# Patient Record
Sex: Male | Born: 1970
Health system: Southern US, Community
[De-identification: ages and names within clinical notes are randomized; demographics above are authoritative.]

## PROBLEM LIST (undated history)

## (undated) DIAGNOSIS — I10 Essential (primary) hypertension: Secondary | ICD-10-CM

## (undated) DIAGNOSIS — G43909 Migraine, unspecified, not intractable, without status migrainosus: Secondary | ICD-10-CM

## (undated) DIAGNOSIS — J45909 Unspecified asthma, uncomplicated: Secondary | ICD-10-CM

## (undated) DIAGNOSIS — E785 Hyperlipidemia, unspecified: Secondary | ICD-10-CM

## (undated) HISTORY — PX: BACK SURGERY: SHX140

## (undated) HISTORY — PX: TONSILLECTOMY: SUR1361

---

## 1999-06-12 ENCOUNTER — Encounter: Payer: Self-pay | Admitting: Emergency Medicine

## 1999-06-13 ENCOUNTER — Inpatient Hospital Stay (HOSPITAL_COMMUNITY): Admission: EM | Admit: 1999-06-13 | Discharge: 1999-06-14 | Payer: Self-pay | Admitting: Emergency Medicine

## 2000-08-10 ENCOUNTER — Ambulatory Visit (HOSPITAL_BASED_OUTPATIENT_CLINIC_OR_DEPARTMENT_OTHER): Admission: RE | Admit: 2000-08-10 | Discharge: 2000-08-11 | Payer: Self-pay | Admitting: *Deleted

## 2003-03-09 ENCOUNTER — Encounter: Admission: RE | Admit: 2003-03-09 | Discharge: 2003-03-09 | Payer: Self-pay | Admitting: Neurosurgery

## 2003-03-09 ENCOUNTER — Encounter: Payer: Self-pay | Admitting: Neurosurgery

## 2003-05-15 ENCOUNTER — Encounter: Admission: RE | Admit: 2003-05-15 | Discharge: 2003-05-15 | Payer: Self-pay | Admitting: Neurosurgery

## 2003-06-03 ENCOUNTER — Encounter: Admission: RE | Admit: 2003-06-03 | Discharge: 2003-06-03 | Payer: Self-pay | Admitting: Neurosurgery

## 2006-08-15 ENCOUNTER — Encounter: Admission: RE | Admit: 2006-08-15 | Discharge: 2006-08-15 | Payer: Self-pay | Admitting: Neurosurgery

## 2013-04-29 ENCOUNTER — Emergency Department (HOSPITAL_COMMUNITY)
Admission: EM | Admit: 2013-04-29 | Discharge: 2013-04-29 | Disposition: A | Discharge status: Home / Self Care | Payer: BC Managed Care – PPO | Attending: Emergency Medicine | Admitting: Emergency Medicine

## 2013-04-29 ENCOUNTER — Encounter (HOSPITAL_COMMUNITY): Payer: Self-pay | Admitting: Emergency Medicine

## 2013-04-29 ENCOUNTER — Emergency Department (HOSPITAL_COMMUNITY): Payer: BC Managed Care – PPO

## 2013-04-29 DIAGNOSIS — I10 Essential (primary) hypertension: Secondary | ICD-10-CM | POA: Insufficient documentation

## 2013-04-29 DIAGNOSIS — R0989 Other specified symptoms and signs involving the circulatory and respiratory systems: Secondary | ICD-10-CM | POA: Insufficient documentation

## 2013-04-29 DIAGNOSIS — R61 Generalized hyperhidrosis: Secondary | ICD-10-CM | POA: Insufficient documentation

## 2013-04-29 DIAGNOSIS — R509 Fever, unspecified: Secondary | ICD-10-CM | POA: Insufficient documentation

## 2013-04-29 DIAGNOSIS — Z8669 Personal history of other diseases of the nervous system and sense organs: Secondary | ICD-10-CM | POA: Insufficient documentation

## 2013-04-29 DIAGNOSIS — R5381 Other malaise: Secondary | ICD-10-CM | POA: Insufficient documentation

## 2013-04-29 DIAGNOSIS — F17200 Nicotine dependence, unspecified, uncomplicated: Secondary | ICD-10-CM | POA: Insufficient documentation

## 2013-04-29 DIAGNOSIS — R110 Nausea: Secondary | ICD-10-CM | POA: Insufficient documentation

## 2013-04-29 DIAGNOSIS — K921 Melena: Secondary | ICD-10-CM | POA: Insufficient documentation

## 2013-04-29 DIAGNOSIS — E785 Hyperlipidemia, unspecified: Secondary | ICD-10-CM | POA: Insufficient documentation

## 2013-04-29 DIAGNOSIS — Z79899 Other long term (current) drug therapy: Secondary | ICD-10-CM | POA: Insufficient documentation

## 2013-04-29 DIAGNOSIS — R000 Tachycardia, unspecified: Secondary | ICD-10-CM | POA: Insufficient documentation

## 2013-04-29 DIAGNOSIS — D72829 Elevated white blood cell count, unspecified: Secondary | ICD-10-CM | POA: Insufficient documentation

## 2013-04-29 DIAGNOSIS — M79609 Pain in unspecified limb: Secondary | ICD-10-CM | POA: Insufficient documentation

## 2013-04-29 DIAGNOSIS — R0682 Tachypnea, not elsewhere classified: Secondary | ICD-10-CM | POA: Insufficient documentation

## 2013-04-29 DIAGNOSIS — M549 Dorsalgia, unspecified: Secondary | ICD-10-CM | POA: Insufficient documentation

## 2013-04-29 DIAGNOSIS — M6281 Muscle weakness (generalized): Secondary | ICD-10-CM | POA: Insufficient documentation

## 2013-04-29 DIAGNOSIS — R0609 Other forms of dyspnea: Secondary | ICD-10-CM | POA: Insufficient documentation

## 2013-04-29 DIAGNOSIS — R0600 Dyspnea, unspecified: Secondary | ICD-10-CM

## 2013-04-29 DIAGNOSIS — G8929 Other chronic pain: Secondary | ICD-10-CM | POA: Insufficient documentation

## 2013-04-29 DIAGNOSIS — J45909 Unspecified asthma, uncomplicated: Secondary | ICD-10-CM | POA: Insufficient documentation

## 2013-04-29 HISTORY — DX: Essential (primary) hypertension: I10

## 2013-04-29 HISTORY — DX: Hyperlipidemia, unspecified: E78.5

## 2013-04-29 HISTORY — DX: Migraine, unspecified, not intractable, without status migrainosus: G43.909

## 2013-04-29 HISTORY — DX: Unspecified asthma, uncomplicated: J45.909

## 2013-04-29 LAB — COMPREHENSIVE METABOLIC PANEL
ALT: 27 U/L (ref 0–53)
AST: 27 U/L (ref 0–37)
Albumin: 3.8 g/dL (ref 3.5–5.2)
Alkaline Phosphatase: 59 U/L (ref 39–117)
BUN: 29 mg/dL — ABNORMAL HIGH (ref 6–23)
CO2: 26 mEq/L (ref 19–32)
Calcium: 8.8 mg/dL (ref 8.4–10.5)
Chloride: 103 mEq/L (ref 96–112)
Creatinine, Ser: 0.67 mg/dL (ref 0.50–1.35)
GFR calc Af Amer: >90 mL/min (ref >90)
GFR calc non Af Amer: >90 mL/min (ref >90)
Glucose, Bld: 107 mg/dL — ABNORMAL HIGH (ref 70–99)
Potassium: 4.2 mEq/L (ref 3.5–5.1)
Sodium: 139 mEq/L (ref 135–145)
Total Bilirubin: 0.5 mg/dL (ref 0.3–1.2)
Total Protein: 6.4 g/dL (ref 6.0–8.3)

## 2013-04-29 LAB — CBC WITH DIFFERENTIAL/PLATELET
Basophils Absolute: 0.1 10*3/uL (ref 0.0–0.1)
Basophils Relative: 0 % (ref 0–1)
Eosinophils Absolute: 0.2 10*3/uL (ref 0.0–0.7)
Eosinophils Relative: 1 % (ref 0–5)
HCT: 35.8 % — ABNORMAL LOW (ref 39.0–52.0)
Hemoglobin: 12.1 g/dL — ABNORMAL LOW (ref 13.0–17.0)
Lymphocytes Relative: 19 % (ref 12–46)
Lymphs Abs: 2.9 10*3/uL (ref 0.7–4.0)
MCH: 30.7 pg (ref 26.0–34.0)
MCHC: 33.8 g/dL (ref 30.0–36.0)
MCV: 90.9 fL (ref 78.0–100.0)
Monocytes Absolute: 1.1 10*3/uL — ABNORMAL HIGH (ref 0.1–1.0)
Monocytes Relative: 7 % (ref 3–12)
Neutro Abs: 10.7 10*3/uL — ABNORMAL HIGH (ref 1.7–7.7)
Neutrophils Relative %: 72 % (ref 43–77)
Platelets: 266 10*3/uL (ref 150–400)
RBC: 3.94 MIL/uL — ABNORMAL LOW (ref 4.22–5.81)
RDW: 13.9 % (ref 11.5–15.5)
WBC: 14.9 10*3/uL — ABNORMAL HIGH (ref 4.0–10.5)

## 2013-04-29 LAB — POCT I-STAT TROPONIN I: Troponin i, poc: 0.01 ng/mL (ref 0.00–0.08)

## 2013-04-29 LAB — URINALYSIS, ROUTINE W REFLEX MICROSCOPIC
Bilirubin Urine: NEGATIVE
Glucose, UA: NEGATIVE mg/dL
Hgb urine dipstick: NEGATIVE
Ketones, ur: 15 mg/dL — AB
Leukocytes, UA: NEGATIVE
Nitrite: NEGATIVE
Protein, ur: NEGATIVE mg/dL
Specific Gravity, Urine: 1.029 (ref 1.005–1.030)
Urobilinogen, UA: 0.2 mg/dL (ref 0.0–1.0)
pH: 5.5 (ref 5.0–8.0)

## 2013-04-29 LAB — D-DIMER, QUANTITATIVE: D-Dimer, Quant: 0.33 ug/mL-FEU (ref 0.00–0.48)

## 2013-04-29 LAB — PRO B NATRIURETIC PEPTIDE: Pro B Natriuretic peptide (BNP): 19.4 pg/mL (ref 0–125)

## 2013-04-29 MED ORDER — ONDANSETRON 4 MG PO TBDP
4.0000 mg | ORAL_TABLET | Freq: Once | ORAL | Status: AC
  Administered 2013-04-29: 4 mg via ORAL
  Filled 2013-04-29: qty 1

## 2013-04-29 MED ORDER — HYDROMORPHONE HCL PF 2 MG/ML IJ SOLN
2.0000 mg | Freq: Once | INTRAMUSCULAR | Status: AC
  Administered 2013-04-29: 2 mg via INTRAVENOUS
  Filled 2013-04-29: qty 1

## 2013-04-29 MED ORDER — ONDANSETRON HCL 4 MG/2ML IJ SOLN
4.0000 mg | Freq: Once | INTRAMUSCULAR | Status: AC
  Administered 2013-04-29: 4 mg via INTRAVENOUS
  Filled 2013-04-29: qty 2

## 2013-04-29 MED ORDER — GADOBENATE DIMEGLUMINE 529 MG/ML IV SOLN
20.0000 mL | Freq: Once | INTRAVENOUS | Status: AC
  Administered 2013-04-29: 20 mL via INTRAVENOUS

## 2013-04-29 MED ORDER — SODIUM CHLORIDE 0.9 % IV BOLUS (SEPSIS)
1000.0000 mL | Freq: Once | INTRAVENOUS | Status: AC
  Administered 2013-04-29: 1000 mL via INTRAVENOUS

## 2013-04-29 NOTE — ED Notes (Signed)
Patient transported to MRI 

## 2013-04-29 NOTE — ED Notes (Signed)
Patient transported to X-ray 

## 2013-04-29 NOTE — ED Notes (Signed)
Pt knows that urine is needed. Pt has urinal at bed side  

## 2013-04-29 NOTE — ED Notes (Signed)
Per EMS Pt came from home c/o SOB that started today. Upon EMS arrival pt was pale, cool and diaphoretic. Pt BP changed from 130/88 lying down to 114 systolic standing up. ST on monitor. Pt also c/o abdominal pain and nausea. EMS gave 4mg  zofran IV. Pt reports not eating and drinking normally yesterday due to being busy with moving out of his house.

## 2013-04-29 NOTE — ED Notes (Signed)
MRI called, pt in too much pain to lay flat on back, requesting pain medications. MD Jeraldine Loots made aware.

## 2013-04-29 NOTE — ED Provider Notes (Addendum)
CSN: [id number]     Arrival date & time 04/29/13  1107 History   First MD Initiated Contact with Patient 04/29/13 1111     Chief Complaint  Patient presents with  . Shortness of Breath  . Nausea   (Consider location/radiation/quality/duration/timing/severity/associated sxs/prior Treatment) HPI Patient presents with generalized weakness and dyspnea.  symptoms began over the past days,and has become more incapacitating. There is no new focal pain, including no chest pain.  However, the patient does have chronic pain in his back, lower legs.  He has history of multiple surgical procedures in his back dating back decades. He notes over the past days, as he has been exerting himself, he has had increasing symptoms. There is no new lower extremity edema, discoloration. Symptoms are worse with exertion. There is mild dyspnea at rest, and no fevers, chills. Patient smokes, was counseled on the need to stop this behavior. Patient is disabled, with chronic back pain. Per EMS, on their arrival the patient was pale, cool, diaphoretic, with orthostatic changes in vital signs.  Past Medical History  Diagnosis Date  . Hypertension   . Asthma   . Hyperlipidemia   . Migraines    Past Surgical History  Procedure Laterality Date  . Back surgery    . Tonsillectomy     No family history on file. History  Substance Use Topics  . Smoking status: Current Every Day Smoker -- 0.50 packs/day  . Smokeless tobacco: Not on file  . Alcohol Use: No    Review of Systems  Constitutional:       Per HPI, otherwise negative  HENT:       Per HPI, otherwise negative  Respiratory:       Per HPI, otherwise negative  Cardiovascular:       Per HPI, otherwise negative  Gastrointestinal: Negative for vomiting.  Endocrine:       Negative aside from HPI  Genitourinary:       Neg aside from HPI   Musculoskeletal:       Per HPI, otherwise negative  Skin: Negative.   Neurological: Negative for syncope.     Allergies  Review of patient's allergies indicates no known allergies.  Home Medications   Current Outpatient Rx  Name  Route  Sig  Dispense  Refill  . albuterol (PROVENTIL HFA;VENTOLIN HFA) 108 (90 BASE) MCG/ACT inhaler   Inhalation   Inhale 1 puff into the lungs every 6 (six) hours as needed for wheezing.         . Aspirin-Acetaminophen-Caffeine (GOODY HEADACHE PO)   Oral   Take 2 packets by mouth daily as needed (for pain).         Marland Kitchen atorvastatin (LIPITOR) 20 MG tablet   Oral   Take 20 mg by mouth daily.         . DULoxetine (CYMBALTA) 60 MG capsule   Oral   Take 60 mg by mouth daily.         Marland Kitchen gabapentin (NEURONTIN) 600 MG tablet   Oral   Take 1,200 mg by mouth at bedtime.         . indomethacin (INDOCIN) 25 MG capsule   Oral   Take 25-75 mg by mouth daily as needed (for migraine).         Marland Kitchen lisinopril (PRINIVIL,ZESTRIL) 20 MG tablet   Oral   Take 20 mg by mouth daily.         Marland Kitchen oxyCODONE-acetaminophen (PERCOCET) 10-325 MG per tablet   Oral  Take 1 tablet by mouth every 4 (four) hours as needed for pain.          BP 121/67  Temp(Src) 100.4 F (38 C) (Oral)  Resp 20  SpO2 97% Physical Exam  Nursing note and vitals reviewed. Constitutional: He is oriented to person, place, and time. He appears well-developed. He appears distressed.  Large male, diaphoretic, uncomfortable appearing  HENT:  Head: Normocephalic and atraumatic.  Eyes: Conjunctivae and EOM are normal.  Cardiovascular: Regular rhythm.  Tachycardia present.   Pulmonary/Chest: No stridor. Tachypnea noted. No respiratory distress.  Abdominal: He exhibits no distension.  Musculoskeletal: He exhibits no edema.  Back has no gross deformity, no spreading erythema, no evidence of superficial infection on surgical sites. Both lower extremities are without deformity, no gross asymmetry.  Neurological: He is alert and oriented to person, place, and time.  Skin: Skin is warm. He is  diaphoretic.  Psychiatric: He has a normal mood and affect.    ED Course  Procedures (including critical care time) Labs Review Labs Reviewed  CBC WITH DIFFERENTIAL  COMPREHENSIVE METABOLIC PANEL  PRO B NATRIURETIC PEPTIDE  URINALYSIS, ROUTINE W REFLEX MICROSCOPIC  D-DIMER, QUANTITATIVE   Imaging Review No results found.  EKG Interpretation     Ventricular Rate:  103 PR Interval:  138 QRS Duration: 85 QT Interval:  346 QTC Calculation: 453 R Axis:   52 Text Interpretation:  Age not entered, assumed to be  42 years old for purpose of ECG interpretation Sinus tachycardia Abnormal ekg            cardiac 110 sinus tachycardia abnormal Pulse oximetry is 97% this is normal  6:51 PM Had a lengthy discussion with the patient, prior to which I discussed the patient's case with our radiologist. Patient continues to deny any abdominal pain.  He now states he feels substantially better. He now states that yesterday he took a substantial amount of Goody powder, aspirin. We had a lengthy discussion about the MRI results, the need for GI followup, primary care followup.  Patient's vital signs, notable for no tachycardia.  Blood pressure is appropriate.   Patient requests discharge.   MDM  This gentleman with multiple prior back surgeries now presents with generalized discomfort, generalized fatigue, dyspnea.  Notably, on initial exam the patient has mild tachycardia, mild fever.  Labs are notable for leukocytosis as well.  With the patient's pain on hip flexion, and signs suggestive of infectious versus inflammatory process, patient underwent MRI.  This was largely reassuring.  Notably, the patient had no abdominal pain throughout, and although he had one episode of blood in stool, the absence of peritonitis or any continued rectal bleeding or any abdominal pain at all suggests a low probability of enteric disease.  Patient's endorsement of using aspirin products suggests possible  GI irritation, but again the patient had no abdominal pain throughout.  After several hours of monitoring here, with no decompensation, and a substantial improvement in his condition, the patient was discharged in stable condition with outpatient followup with primary care, pain management, and gastroenterology.    Gerhard Munch, MD 04/29/13 1858  7:09 PM Patient now voices concern to the nursing staff that his illness may be to exposure to Ebola.  He was at a party three days ago, and he voices concern that another attendee may have been ill.  Given the documented time course of Ebola, this concern seems unlikely.  Gerhard Munch, MD 04/29/13 1910

## 2013-04-29 NOTE — ED Notes (Signed)
Pt given urinal to obtain specimen. 

## 2013-05-06 ENCOUNTER — Other Ambulatory Visit: Payer: Self-pay | Admitting: Pain Medicine

## 2013-05-06 DIAGNOSIS — G8929 Other chronic pain: Secondary | ICD-10-CM

## 2013-05-06 DIAGNOSIS — M545 Low back pain, unspecified: Secondary | ICD-10-CM

## 2013-05-06 DIAGNOSIS — R200 Anesthesia of skin: Secondary | ICD-10-CM

## 2013-05-06 DIAGNOSIS — M79605 Pain in left leg: Secondary | ICD-10-CM

## 2013-05-13 ENCOUNTER — Other Ambulatory Visit: Payer: BC Managed Care – PPO

## 2013-05-21 ENCOUNTER — Other Ambulatory Visit: Payer: BC Managed Care – PPO

## 2014-09-18 ENCOUNTER — Ambulatory Visit
Admission: RE | Admit: 2014-09-18 | Discharge: 2014-09-18 | Disposition: A | Discharge status: Home / Self Care | Payer: BLUE CROSS/BLUE SHIELD | Source: Ambulatory Visit | Attending: Family Medicine | Admitting: Family Medicine

## 2014-09-18 ENCOUNTER — Other Ambulatory Visit: Payer: Self-pay | Admitting: Family Medicine

## 2015-01-12 ENCOUNTER — Encounter (HOSPITAL_COMMUNITY): Payer: Self-pay | Admitting: Emergency Medicine

## 2015-01-12 DIAGNOSIS — G43909 Migraine, unspecified, not intractable, without status migrainosus: Secondary | ICD-10-CM | POA: Diagnosis not present

## 2015-01-12 DIAGNOSIS — Z792 Long term (current) use of antibiotics: Secondary | ICD-10-CM | POA: Insufficient documentation

## 2015-01-12 DIAGNOSIS — Z720 Tobacco use: Secondary | ICD-10-CM | POA: Insufficient documentation

## 2015-01-12 DIAGNOSIS — J159 Unspecified bacterial pneumonia: Secondary | ICD-10-CM | POA: Diagnosis not present

## 2015-01-12 DIAGNOSIS — J4520 Mild intermittent asthma, uncomplicated: Secondary | ICD-10-CM | POA: Diagnosis not present

## 2015-01-12 DIAGNOSIS — E785 Hyperlipidemia, unspecified: Secondary | ICD-10-CM | POA: Insufficient documentation

## 2015-01-12 DIAGNOSIS — R0602 Shortness of breath: Secondary | ICD-10-CM | POA: Diagnosis present

## 2015-01-12 DIAGNOSIS — I10 Essential (primary) hypertension: Secondary | ICD-10-CM | POA: Insufficient documentation

## 2015-01-12 DIAGNOSIS — J45909 Unspecified asthma, uncomplicated: Secondary | ICD-10-CM | POA: Insufficient documentation

## 2015-01-12 DIAGNOSIS — Z79899 Other long term (current) drug therapy: Secondary | ICD-10-CM | POA: Diagnosis not present

## 2015-01-12 MED ORDER — ALBUTEROL SULFATE (2.5 MG/3ML) 0.083% IN NEBU
5.0000 mg | INHALATION_SOLUTION | Freq: Once | RESPIRATORY_TRACT | Status: AC
  Administered 2015-01-12: 5 mg via RESPIRATORY_TRACT
  Filled 2015-01-12: qty 6

## 2015-01-12 NOTE — ED Notes (Signed)
Pt. reports SOB with dry cough and fever onset today unrelieved by rescue inhaler .

## 2015-01-13 ENCOUNTER — Emergency Department (HOSPITAL_COMMUNITY): Payer: BLUE CROSS/BLUE SHIELD

## 2015-01-13 ENCOUNTER — Emergency Department (HOSPITAL_COMMUNITY)
Admission: EM | Admit: 2015-01-13 | Discharge: 2015-01-13 | Disposition: A | Discharge status: Home / Self Care | Payer: BLUE CROSS/BLUE SHIELD | Attending: Emergency Medicine | Admitting: Emergency Medicine

## 2015-01-13 DIAGNOSIS — J189 Pneumonia, unspecified organism: Secondary | ICD-10-CM

## 2015-01-13 DIAGNOSIS — J4520 Mild intermittent asthma, uncomplicated: Secondary | ICD-10-CM

## 2015-01-13 LAB — CBC
HCT: 36.5 % — ABNORMAL LOW (ref 39.0–52.0)
Hemoglobin: 11.8 g/dL — ABNORMAL LOW (ref 13.0–17.0)
MCH: 28 pg (ref 26.0–34.0)
MCHC: 32.3 g/dL (ref 30.0–36.0)
MCV: 86.7 fL (ref 78.0–100.0)
Platelets: 251 10*3/uL (ref 150–400)
RBC: 4.21 MIL/uL — ABNORMAL LOW (ref 4.22–5.81)
RDW: 16.9 % — ABNORMAL HIGH (ref 11.5–15.5)
WBC: 11.9 10*3/uL — ABNORMAL HIGH (ref 4.0–10.5)

## 2015-01-13 LAB — BASIC METABOLIC PANEL
Anion gap: 11 (ref 5–15)
BUN: 10 mg/dL (ref 6–20)
CO2: 25 mmol/L (ref 22–32)
Calcium: 8.5 mg/dL — ABNORMAL LOW (ref 8.9–10.3)
Chloride: 104 mmol/L (ref 101–111)
Creatinine, Ser: 0.86 mg/dL (ref 0.61–1.24)
GFR calc Af Amer: >60 mL/min (ref >60)
GFR calc non Af Amer: >60 mL/min (ref >60)
Glucose, Bld: 109 mg/dL — ABNORMAL HIGH (ref 65–99)
Potassium: 3.6 mmol/L (ref 3.5–5.1)
Sodium: 140 mmol/L (ref 135–145)

## 2015-01-13 MED ORDER — STERILE WATER FOR INJECTION IJ SOLN
INTRAMUSCULAR | Status: AC
  Administered 2015-01-13: 2.1 mL
  Filled 2015-01-13: qty 10

## 2015-01-13 MED ORDER — AZITHROMYCIN 250 MG PO TABS
500.0000 mg | ORAL_TABLET | Freq: Once | ORAL | Status: AC
  Administered 2015-01-13: 500 mg via ORAL
  Filled 2015-01-13: qty 2

## 2015-01-13 MED ORDER — AZITHROMYCIN 250 MG PO TABS: 250.0000 mg | ORAL_TABLET | Freq: Every day | ORAL | Status: DC

## 2015-01-13 MED ORDER — CEFTRIAXONE SODIUM 1 G IJ SOLR
1.0000 g | Freq: Once | INTRAMUSCULAR | Status: AC
  Administered 2015-01-13: 1 g via INTRAMUSCULAR
  Filled 2015-01-13: qty 10

## 2015-01-13 NOTE — Discharge Instructions (Signed)
Her x-ray shows that you have pneumonia.  You've been given an injection of antibiotic-coated Rocephin and your first dose of azithromycin, you have been given a prescription for the remaining doses of azithromycin.  Please take this in the evening for the next 4 nights.  He can use her inhaler as needed.  Follow-up with your primary care physician.  Return anytime you develop new or worsening symptoms, shortness of breath, chest pain, fever, that will not resolve with Tylenol or ibuprofen

## 2015-01-13 NOTE — ED Provider Notes (Signed)
CSN: [id number]     Arrival date & time 01/12/15  2342 History   First MD Initiated Contact with Patient 01/13/15 [phone]     Chief Complaint  Patient presents with  . Shortness of Breath  . Cough     (Consider location/radiation/quality/duration/timing/severity/associated sxs/prior Treatment) HPI Comments: This is a 44 year old male with a history of mild intermittent asthma who states for the past 3 days.  He's not felt well.  He said fever up to 102.7 today.  He's had increased use of his inhaler and still feels short of breath.  He also reports a dry cough.  Patient is a 44 y.o. male presenting with shortness of breath and cough. The history is provided by the patient.  Shortness of Breath Severity:  Moderate Onset quality:  Gradual Duration:  3 days Timing:  Intermittent Progression:  Worsening Chronicity:  New Context: activity   Relieved by:  Nothing Worsened by:  Activity Ineffective treatments:  Inhaler Associated symptoms: cough and fever   Associated symptoms: no headaches, no rash, no vomiting and no wheezing   Cough Associated symptoms: fever and shortness of breath   Associated symptoms: no headaches, no myalgias, no rash and no wheezing     Past Medical History  Diagnosis Date  . Hypertension   . Asthma   . Hyperlipidemia   . Migraines    Past Surgical History  Procedure Laterality Date  . Back surgery    . Tonsillectomy     No family history on file. History  Substance Use Topics  . Smoking status: Current Every Day Smoker -- 0.50 packs/day  . Smokeless tobacco: Not on file  . Alcohol Use: No    Review of Systems  Constitutional: Positive for fever.  Respiratory: Positive for cough and shortness of breath. Negative for wheezing.   Gastrointestinal: Negative for nausea and vomiting.  Musculoskeletal: Negative for myalgias.  Skin: Negative for rash.  Neurological: Negative for dizziness and headaches.  All other systems reviewed and are  negative.     Allergies  Review of patient's allergies indicates no known allergies.  Home Medications   Prior to Admission medications   Medication Sig Start Date End Date Taking? Authorizing Provider  albuterol (PROVENTIL HFA;VENTOLIN HFA) 108 (90 BASE) MCG/ACT inhaler Inhale 1 puff into the lungs every 6 (six) hours as needed for wheezing.    Historical Provider, MD  Aspirin-Acetaminophen-Caffeine (GOODY HEADACHE PO) Take 2 packets by mouth daily as needed (for pain).    Historical Provider, MD  atorvastatin (LIPITOR) 20 MG tablet Take 20 mg by mouth daily.    Historical Provider, MD  azithromycin (ZITHROMAX) 250 MG tablet Take 1 tablet (250 mg total) by mouth daily. 01/13/15   Earley FavorGail Chick Cousins, NP  DULoxetine (CYMBALTA) 60 MG capsule Take 60 mg by mouth daily.    Historical Provider, MD  gabapentin (NEURONTIN) 600 MG tablet Take 1,200 mg by mouth at bedtime.    Historical Provider, MD  indomethacin (INDOCIN) 25 MG capsule Take 25-75 mg by mouth daily as needed (for migraine).    Historical Provider, MD  lisinopril (PRINIVIL,ZESTRIL) 20 MG tablet Take 20 mg by mouth daily.    Historical Provider, MD  oxyCODONE-acetaminophen (PERCOCET) 10-325 MG per tablet Take 1 tablet by mouth every 4 (four) hours as needed for pain.    Historical Provider, MD   BP 138/77 mmHg  Pulse 96  Temp(Src) 99.9 F (37.7 C) (Oral)  Resp 20  SpO2 95% Physical Exam  Constitutional: He is oriented  to person, place, and time. He appears well-developed and well-nourished.  HENT:  Head: Normocephalic.  Eyes: Pupils are equal, round, and reactive to light.  Neck: Normal range of motion.  Cardiovascular: Normal rate and regular rhythm.   Pulmonary/Chest: Effort normal and breath sounds normal. No respiratory distress. He has no wheezes. He has no rales. He exhibits no tenderness.  Musculoskeletal: Normal range of motion. He exhibits no edema or tenderness.  Neurological: He is alert and oriented to person, place, and  time.  Skin: Skin is warm. No rash noted.  Nursing note and vitals reviewed.   ED Course  Procedures (including critical care time) Labs Review Labs Reviewed  BASIC METABOLIC PANEL - Abnormal; Notable for the following:    Glucose, Bld 109 (*)    Calcium 8.5 (*)    All other components within normal limits  CBC - Abnormal; Notable for the following:    WBC 11.9 (*)    RBC 4.21 (*)    Hemoglobin 11.8 (*)    HCT 36.5 (*)    RDW 16.9 (*)    All other components within normal limits    Imaging Review Dg Chest 2 View (if Patient Has Fever And/or Copd)  01/13/2015   CLINICAL DATA:  44 year old male with shortness of breath  EXAM: CHEST  2 VIEW  COMPARISON:  Chest radiograph dated 04/29/2013  FINDINGS: Two views of the chest demonstrate diffuse interstitial prominence and nodularity with a focal area of increased opacity in the right middle lobe concerning for developing pneumonia. No significant pleural effusion. No pneumothorax. Stable cardiac silhouette. The osseous structures are grossly unremarkable. Anterior cervical fixation plate and screws.  IMPRESSION: Findings concerning for developing pneumonia. Clinical correlation and follow-up recommended.   Electronically Signed   By: Elgie Collard M.D.   On: 01/13/2015 00:27     EKG Interpretation None     Asians.  Oxygen saturation on room air is 94-96% while talking I discussed this diagnosis with him.  He will be given a shot of Rocephin and the first dose of azithromycin.  He will be given a prescription for azithromycin to continue taking over the next 4 days.  He can use his albuterol inhaler as needed.  Follow-up with his primary care physician  MDM   Final diagnoses:  CAP (community acquired pneumonia)  Asthma, mild intermittent, uncomplicated         Earley Favor, NP 01/13/15 0104  Shon Baton, MD 01/13/15 [phone]

## 2015-01-28 ENCOUNTER — Other Ambulatory Visit: Payer: Self-pay | Admitting: Pain Medicine

## 2015-01-28 DIAGNOSIS — M545 Low back pain, unspecified: Secondary | ICD-10-CM

## 2015-02-11 ENCOUNTER — Ambulatory Visit
Admission: RE | Admit: 2015-02-11 | Discharge: 2015-02-11 | Disposition: A | Discharge status: Home / Self Care | Payer: BLUE CROSS/BLUE SHIELD | Source: Ambulatory Visit | Attending: Pain Medicine | Admitting: Pain Medicine

## 2015-02-11 DIAGNOSIS — M545 Low back pain, unspecified: Secondary | ICD-10-CM

## 2015-08-09 ENCOUNTER — Other Ambulatory Visit: Payer: Self-pay | Admitting: Pain Medicine

## 2015-08-09 DIAGNOSIS — M542 Cervicalgia: Secondary | ICD-10-CM

## 2015-08-15 ENCOUNTER — Other Ambulatory Visit: Payer: BLUE CROSS/BLUE SHIELD

## 2015-08-16 ENCOUNTER — Ambulatory Visit
Admission: RE | Admit: 2015-08-16 | Discharge: 2015-08-16 | Disposition: A | Discharge status: Home / Self Care | Payer: BLUE CROSS/BLUE SHIELD | Source: Ambulatory Visit | Attending: Pain Medicine | Admitting: Pain Medicine

## 2015-08-16 DIAGNOSIS — M542 Cervicalgia: Secondary | ICD-10-CM

## 2015-08-18 ENCOUNTER — Other Ambulatory Visit: Payer: Self-pay | Admitting: Pain Medicine

## 2015-08-18 DIAGNOSIS — M542 Cervicalgia: Secondary | ICD-10-CM

## 2017-03-01 ENCOUNTER — Inpatient Hospital Stay (HOSPITAL_COMMUNITY)
Admission: EM | Admit: 2017-03-01 | Discharge: 2017-03-03 | DRG: 378 | Disposition: A | Discharge status: Home / Self Care | Payer: BLUE CROSS/BLUE SHIELD | Attending: Internal Medicine | Admitting: Internal Medicine

## 2017-03-01 ENCOUNTER — Encounter (HOSPITAL_COMMUNITY): Payer: Self-pay

## 2017-03-01 DIAGNOSIS — M545 Low back pain, unspecified: Secondary | ICD-10-CM

## 2017-03-01 DIAGNOSIS — G8929 Other chronic pain: Secondary | ICD-10-CM | POA: Diagnosis present

## 2017-03-01 DIAGNOSIS — R5383 Other fatigue: Secondary | ICD-10-CM | POA: Diagnosis present

## 2017-03-01 DIAGNOSIS — F329 Major depressive disorder, single episode, unspecified: Secondary | ICD-10-CM | POA: Diagnosis present

## 2017-03-01 DIAGNOSIS — K922 Gastrointestinal hemorrhage, unspecified: Secondary | ICD-10-CM | POA: Diagnosis present

## 2017-03-01 DIAGNOSIS — D500 Iron deficiency anemia secondary to blood loss (chronic): Secondary | ICD-10-CM | POA: Diagnosis present

## 2017-03-01 DIAGNOSIS — J45909 Unspecified asthma, uncomplicated: Secondary | ICD-10-CM | POA: Diagnosis present

## 2017-03-01 DIAGNOSIS — K449 Diaphragmatic hernia without obstruction or gangrene: Secondary | ICD-10-CM | POA: Diagnosis present

## 2017-03-01 DIAGNOSIS — D649 Anemia, unspecified: Secondary | ICD-10-CM | POA: Diagnosis present

## 2017-03-01 DIAGNOSIS — E785 Hyperlipidemia, unspecified: Secondary | ICD-10-CM | POA: Diagnosis present

## 2017-03-01 DIAGNOSIS — Z8711 Personal history of peptic ulcer disease: Secondary | ICD-10-CM | POA: Diagnosis not present

## 2017-03-01 DIAGNOSIS — E876 Hypokalemia: Secondary | ICD-10-CM | POA: Diagnosis present

## 2017-03-01 DIAGNOSIS — F17210 Nicotine dependence, cigarettes, uncomplicated: Secondary | ICD-10-CM | POA: Diagnosis present

## 2017-03-01 DIAGNOSIS — M549 Dorsalgia, unspecified: Secondary | ICD-10-CM | POA: Diagnosis present

## 2017-03-01 DIAGNOSIS — D62 Acute posthemorrhagic anemia: Secondary | ICD-10-CM | POA: Diagnosis present

## 2017-03-01 DIAGNOSIS — K254 Chronic or unspecified gastric ulcer with hemorrhage: Secondary | ICD-10-CM | POA: Diagnosis not present

## 2017-03-01 DIAGNOSIS — I10 Essential (primary) hypertension: Secondary | ICD-10-CM | POA: Diagnosis present

## 2017-03-01 DIAGNOSIS — T39395A Adverse effect of other nonsteroidal anti-inflammatory drugs [NSAID], initial encounter: Secondary | ICD-10-CM | POA: Diagnosis present

## 2017-03-01 LAB — CBC WITH DIFFERENTIAL/PLATELET
Band Neutrophils: 0 %
Basophils Absolute: 0.1 10*3/uL (ref 0.0–0.1)
Basophils Relative: 1 %
Blasts: 0 %
Eosinophils Absolute: 0.5 10*3/uL (ref 0.0–0.7)
Eosinophils Relative: 8 %
HCT: 22.3 % — ABNORMAL LOW (ref 39.0–52.0)
Hemoglobin: 6.1 g/dL — CL (ref 13.0–17.0)
Lymphocytes Relative: 19 %
Lymphs Abs: 1.2 10*3/uL (ref 0.7–4.0)
MCH: 18.4 pg — ABNORMAL LOW (ref 26.0–34.0)
MCHC: 27.4 g/dL — ABNORMAL LOW (ref 30.0–36.0)
MCV: 67.2 fL — ABNORMAL LOW (ref 78.0–100.0)
Metamyelocytes Relative: 0 %
Monocytes Absolute: 0.4 10*3/uL (ref 0.1–1.0)
Monocytes Relative: 6 %
Myelocytes: 0 %
Neutro Abs: 4.2 10*3/uL (ref 1.7–7.7)
Neutrophils Relative %: 66 %
Other: 0 %
Platelets: 364 10*3/uL (ref 150–400)
Promyelocytes Absolute: 0 %
RBC: 3.32 MIL/uL — ABNORMAL LOW (ref 4.22–5.81)
RDW: 20.8 % — ABNORMAL HIGH (ref 11.5–15.5)
WBC: 6.4 10*3/uL (ref 4.0–10.5)
nRBC: 0 /100 WBC

## 2017-03-01 LAB — COMPREHENSIVE METABOLIC PANEL
ALT: 10 U/L — ABNORMAL LOW (ref 17–63)
AST: 18 U/L (ref 15–41)
Albumin: 3.7 g/dL (ref 3.5–5.0)
Alkaline Phosphatase: 53 U/L (ref 38–126)
Anion gap: 11 (ref 5–15)
BUN: 6 mg/dL (ref 6–20)
CO2: 21 mmol/L — ABNORMAL LOW (ref 22–32)
Calcium: 8.5 mg/dL — ABNORMAL LOW (ref 8.9–10.3)
Chloride: 109 mmol/L (ref 101–111)
Creatinine, Ser: 0.69 mg/dL (ref 0.61–1.24)
GFR calc Af Amer: >60 mL/min (ref >60)
GFR calc non Af Amer: >60 mL/min (ref >60)
Glucose, Bld: 94 mg/dL (ref 65–99)
Potassium: 3.5 mmol/L (ref 3.5–5.1)
Sodium: 141 mmol/L (ref 135–145)
Total Bilirubin: 0.3 mg/dL (ref 0.3–1.2)
Total Protein: 6.5 g/dL (ref 6.5–8.1)

## 2017-03-01 LAB — PREPARE RBC (CROSSMATCH)

## 2017-03-01 LAB — ABO/RH: ABO/RH(D): B POS

## 2017-03-01 LAB — POC OCCULT BLOOD, ED: Fecal Occult Bld: POSITIVE — AB

## 2017-03-01 MED ORDER — FAMOTIDINE IN NACL 20-0.9 MG/50ML-% IV SOLN
20.0000 mg | Freq: Once | INTRAVENOUS | Status: AC
  Administered 2017-03-01: 20 mg via INTRAVENOUS
  Filled 2017-03-01: qty 50

## 2017-03-01 MED ORDER — SODIUM CHLORIDE 0.9 % IV SOLN
Freq: Once | INTRAVENOUS | Status: AC
Start: 2017-03-01 — End: 2017-03-01
  Administered 2017-03-01: 20:00:00 via INTRAVENOUS

## 2017-03-01 MED ORDER — OXYCODONE HCL 5 MG PO TABS
5.0000 mg | ORAL_TABLET | Freq: Three times a day (TID) | ORAL | Status: DC | PRN
  Administered 2017-03-02 – 2017-03-03 (×3): 5 mg via ORAL
  Filled 2017-03-01 (×3): qty 1

## 2017-03-01 MED ORDER — ALBUTEROL SULFATE (2.5 MG/3ML) 0.083% IN NEBU
3.0000 mL | INHALATION_SOLUTION | Freq: Once | RESPIRATORY_TRACT | Status: AC
  Administered 2017-03-02: 3 mL via RESPIRATORY_TRACT
  Filled 2017-03-01: qty 3

## 2017-03-01 MED ORDER — OXYCODONE HCL ER 10 MG PO T12A
10.0000 mg | EXTENDED_RELEASE_TABLET | Freq: Two times a day (BID) | ORAL | Status: DC
  Administered 2017-03-02 – 2017-03-03 (×4): 10 mg via ORAL
  Filled 2017-03-01 (×4): qty 1

## 2017-03-01 MED ORDER — ATORVASTATIN CALCIUM 20 MG PO TABS
20.0000 mg | ORAL_TABLET | Freq: Every day | ORAL | Status: DC
  Administered 2017-03-02 – 2017-03-03 (×2): 20 mg via ORAL
  Filled 2017-03-01 (×3): qty 1

## 2017-03-01 MED ORDER — ONDANSETRON HCL 4 MG/2ML IJ SOLN
4.0000 mg | Freq: Four times a day (QID) | INTRAMUSCULAR | Status: DC | PRN
  Administered 2017-03-02: 4 mg via INTRAVENOUS
  Filled 2017-03-01: qty 2

## 2017-03-01 MED ORDER — SODIUM CHLORIDE 0.9 % IV SOLN
8.0000 mg/h | INTRAVENOUS | Status: DC
  Administered 2017-03-02 (×2): 8 mg/h via INTRAVENOUS
  Filled 2017-03-01 (×4): qty 80

## 2017-03-01 MED ORDER — SODIUM CHLORIDE 0.9 % IV SOLN
80.0000 mg | Freq: Once | INTRAVENOUS | Status: AC
  Administered 2017-03-01: 80 mg via INTRAVENOUS
  Filled 2017-03-01: qty 80

## 2017-03-01 MED ORDER — PANTOPRAZOLE SODIUM 40 MG IV SOLR: 40.0000 mg | Freq: Two times a day (BID) | INTRAVENOUS | Status: DC

## 2017-03-01 MED ORDER — ALBUTEROL SULFATE (2.5 MG/3ML) 0.083% IN NEBU: 3.0000 mL | INHALATION_SOLUTION | Freq: Four times a day (QID) | RESPIRATORY_TRACT | Status: DC | PRN

## 2017-03-01 MED ORDER — ACETAMINOPHEN 650 MG RE SUPP: 650.0000 mg | Freq: Four times a day (QID) | RECTAL | Status: DC | PRN

## 2017-03-01 MED ORDER — ALBUTEROL SULFATE HFA 108 (90 BASE) MCG/ACT IN AERS: 1.0000 | INHALATION_SPRAY | Freq: Once | RESPIRATORY_TRACT | Status: DC

## 2017-03-01 MED ORDER — ACETAMINOPHEN 325 MG PO TABS: 650.0000 mg | ORAL_TABLET | Freq: Four times a day (QID) | ORAL | Status: DC | PRN

## 2017-03-01 MED ORDER — SERTRALINE HCL 50 MG PO TABS
50.0000 mg | ORAL_TABLET | Freq: Every day | ORAL | Status: DC
  Administered 2017-03-02 – 2017-03-03 (×2): 50 mg via ORAL
  Filled 2017-03-01 (×2): qty 1

## 2017-03-01 MED ORDER — GABAPENTIN 600 MG PO TABS
600.0000 mg | ORAL_TABLET | Freq: Three times a day (TID) | ORAL | Status: DC
  Administered 2017-03-02 – 2017-03-03 (×4): 600 mg via ORAL
  Filled 2017-03-01 (×4): qty 1

## 2017-03-01 MED ORDER — ONDANSETRON HCL 4 MG PO TABS: 4.0000 mg | ORAL_TABLET | Freq: Four times a day (QID) | ORAL | Status: DC | PRN

## 2017-03-01 NOTE — H&P (Signed)
History and Physical    Jack Greene DOB: 04-19-1971 DOA: 03/01/2017  PCP: Farris Has, MD  Patient coming from: Home  I have personally briefly reviewed patient's old medical records in [facility]  Chief Complaint: Fatigue, low HGB  HPI: Jack Greene is a 46 y.o. male with medical history significant of chronic back pain, takes large quantities of BC goody's powder 4-6 times daily in addition to other meds including indomethacin.  Patient presents to the ED with c/o fatigue, generalized weakness.  Saw PCP, lab work showed HGB 6.2.  Patient called and sent in to ED.  Patient endorses occasional dark stools, especially over last 3 days.  HGB was 14.x back in May of this year according to care everywhere.  Occasional mild epigastric pain with eating.   ED Course: Hemoccult positive, HGB 6.1.   Review of Systems: As per HPI otherwise 10 point review of systems negative.   Past Medical History:  Diagnosis Date  . Asthma   . Hyperlipidemia   . Hypertension   . Migraines     Past Surgical History:  Procedure Laterality Date  . BACK SURGERY    . TONSILLECTOMY       reports that he has been smoking Cigarettes.  He has been smoking about 1.00 pack per day. He does not have any smokeless tobacco history on file. He reports that he does not drink alcohol or use drugs.  No Known Allergies  History reviewed. No pertinent family history.   Prior to Admission medications   Medication Sig Start Date End Date Taking? Authorizing Provider  albuterol (PROVENTIL HFA;VENTOLIN HFA) 108 (90 BASE) MCG/ACT inhaler Inhale 1 puff into the lungs every 6 (six) hours as needed for wheezing.   Yes [provider]  atorvastatin (LIPITOR) 20 MG tablet Take 20 mg by mouth daily.   Yes [provider]  gabapentin (NEURONTIN) 600 MG tablet Take 600 mg by mouth 3 (three) times daily.    Yes [provider]  OXAYDO 5 MG TABA Take 5 mg by  mouth 3 (three) times daily as needed for pain. 02/01/17  Yes [provider]  sertraline (ZOLOFT) 50 MG tablet Take 50 mg by mouth daily. 02/28/17  Yes [provider]  XTAMPZA ER 9 MG C12A Take 9 mg by mouth 2 (two) times daily. 03/01/17  Yes [provider]    Physical Exam: Vitals:   03/01/17 2000 03/01/17 2030 03/01/17 2052 03/01/17 2130  BP: 135/77 128/68 136/78 130/67  Pulse: (!) 56 64 69 65  Resp: 15 17 14 14   Temp:  98.9 F (37.2 C) 98.5 F (36.9 C)   TempSrc:  Oral Oral   SpO2: 100% 100% 100% 100%  Weight:      Height:        Constitutional: NAD, calm, comfortable Eyes: PERRL, lids and conjunctivae normal ENMT: Mucous membranes are moist. Posterior pharynx clear of any exudate or lesions.Normal dentition.  Neck: normal, supple, no masses, no thyromegaly Respiratory: clear to auscultation bilaterally, no wheezing, no crackles. Normal respiratory effort. No accessory muscle use.  Cardiovascular: Regular rate and rhythm, no murmurs / rubs / gallops. No extremity edema. 2+ pedal pulses. No carotid bruits.  Abdomen: no tenderness, no masses palpated. No hepatosplenomegaly. Bowel sounds positive.  Musculoskeletal: no clubbing / cyanosis. No joint deformity upper and lower extremities. Good ROM, no contractures. Normal muscle tone.  Skin: no rashes, lesions, ulcers. No induration Neurologic: CN 2-12 grossly intact. Sensation intact,  DTR normal. Strength 5/5 in all 4.  Psychiatric: Normal judgment and insight. Alert and oriented x 3. Normal mood.    Labs on Admission: I have personally reviewed following labs and imaging studies  CBC:  Recent Labs Lab 03/01/17 1733  WBC 6.4  NEUTROABS 4.2  HGB 6.1*  HCT 22.3*  MCV 67.2*  PLT 364   Basic Metabolic Panel:  Recent Labs Lab 03/01/17 1733  NA 141  K 3.5  CL 109  CO2 21*  GLUCOSE 94  BUN 6  CREATININE 0.69  CALCIUM 8.5*   GFR: Estimated Creatinine Clearance: 157 mL/min (by C-G formula  based on SCr of 0.69 mg/dL). Liver Function Tests:  Recent Labs Lab 03/01/17 1733  AST 18  ALT 10*  ALKPHOS 53  BILITOT 0.3  PROT 6.5  ALBUMIN 3.7   No results for input(s): LIPASE, AMYLASE in the last 168 hours. No results for input(s): AMMONIA in the last 168 hours. Coagulation Profile: No results for input(s): INR, PROTIME in the last 168 hours. Cardiac Enzymes: No results for input(s): CKTOTAL, CKMB, CKMBINDEX, TROPONINI in the last 168 hours. BNP (last 3 results) No results for input(s): PROBNP in the last 8760 hours. HbA1C: No results for input(s): HGBA1C in the last 72 hours. CBG: No results for input(s): GLUCAP in the last 168 hours. Lipid Profile: No results for input(s): CHOL, HDL, LDLCALC, TRIG, CHOLHDL, LDLDIRECT in the last 72 hours. Thyroid Function Tests: No results for input(s): TSH, T4TOTAL, FREET4, T3FREE, THYROIDAB in the last 72 hours. Anemia Panel: No results for input(s): VITAMINB12, FOLATE, FERRITIN, TIBC, IRON, RETICCTPCT in the last 72 hours. Urine analysis:    Component Value Date/Time   COLORURINE YELLOW 04/29/2013 1244   APPEARANCEUR CLEAR 04/29/2013 1244   LABSPEC 1.029 04/29/2013 1244   PHURINE 5.5 04/29/2013 1244   GLUCOSEU NEGATIVE 04/29/2013 1244   HGBUR NEGATIVE 04/29/2013 1244   BILIRUBINUR NEGATIVE 04/29/2013 1244   KETONESUR 15 (A) 04/29/2013 1244   PROTEINUR NEGATIVE 04/29/2013 1244   UROBILINOGEN 0.2 04/29/2013 1244   NITRITE NEGATIVE 04/29/2013 1244   LEUKOCYTESUR NEGATIVE 04/29/2013 1244    Radiological Exams on Admission: No results found.  EKG: Independently reviewed.  Assessment/Plan Principal Problem:   UGIB (upper gastrointestinal bleed) Active Problems:   Symptomatic anemia    1. UGIB - highly suspect an NSAID induced ulcer as cause 1. PPI bolus and gtt 2. Stop NSAIDS 3. Call GI in AM for likely EGD 2. Symptomatic anemia - due to acute blood loss 1. 2 U PRBC transfusion ordered 2. Repeat CBC in  AM 3. Chronic pain - 1. Continue all home meds EXCEPT NSAIDS 2. Did discuss that if NSAID ulcer confirmed on EGD tomorrow, that NSAIDS were now something that he probably couldn't take again ever safely in the future.  DVT prophylaxis: SCDs Code Status: Full Family Communication: No family in room Disposition Plan: Home after admit Consults called: None Admission status: Admit to inpatient - inpatient for GIB with symptomatic anemia requiring transfusion and likely EGD.   Hillary Bow DO [facility] Pager [phone]  If 7AM-7PM, please contact day team taking care of patient [email or web address] Password [email or web address]  03/01/2017, 9:41 PM

## 2017-03-01 NOTE — ED Triage Notes (Addendum)
Pt endorses being sent by PCP for low hgb 6.2. Pt seen pcp for left foot swelling and had an US done and did not have a dvt. VSS. Pt pale and has been feeling very weak recently. Pt also endorses taking goody powders daily for headaches. Denies blood in stool.

## 2017-03-01 NOTE — ED Notes (Signed)
Admitting by the bedside 

## 2017-03-01 NOTE — ED Provider Notes (Signed)
[facility]-EMERGENCY DEPT Provider Note   CSN: [id number] Arrival date & time: 03/01/17  1712     History   Chief Complaint Chief Complaint  Patient presents with  . Abnormal Lab  . Anemia    HPI JENNIFER HOLLAND is a 46 y.o. male with history of asthma, HLD, HTN, and migraines who presents today with chief complaint of gradual onset, progressively worsening lightheadedness and generalized fatigue. He states that symptoms have been ongoing and worsening for several months. He went to see his primary care doctor yesterday Dr. Cheri Guppy with [facility] for evaluation of intermittent left ankle and foot swelling. He had a DVT study which was found to be negative. He received a call today from their office stating that his hemoglobin was very low and that he should come to the emergency department for evaluation. He endorses lightheadedness on changing positions as well as generalized fatigue and DOE. He also endorses intermittent epigastric gnawing pain and dark tarry stools which has been ongoing for several months. He takes 4-6 packets of Goody's powder daily for chronic problems such as headaches and back pain. Currently endorses mild epigastric gnawing abdominal pain which he thinks is due to hunger. Denies chest pain, syncope, numbness, worsening headaches, dysuria, hematuria.  The history is provided by the patient.  Anemia  Associated symptoms include abdominal pain, headaches (chronic, unchanged) and shortness of breath. Pertinent negatives include no chest pain.    Past Medical History:  Diagnosis Date  . Asthma   . Hyperlipidemia   . Hypertension   . Migraines     Patient Active Problem List   Diagnosis Date Noted  . UGIB (upper gastrointestinal bleed) 03/01/2017  . Symptomatic anemia 03/01/2017    Past Surgical History:  Procedure Laterality Date  . BACK SURGERY    . TONSILLECTOMY         Home Medications    Prior to Admission medications   Medication  Sig Start Date End Date Taking? Authorizing Provider  albuterol (PROVENTIL HFA;VENTOLIN HFA) 108 (90 BASE) MCG/ACT inhaler Inhale 1 puff into the lungs every 6 (six) hours as needed for wheezing.   Yes [provider]  atorvastatin (LIPITOR) 20 MG tablet Take 20 mg by mouth daily.   Yes [provider]  gabapentin (NEURONTIN) 600 MG tablet Take 600 mg by mouth 3 (three) times daily.    Yes [provider]  OXAYDO 5 MG TABA Take 5 mg by mouth 3 (three) times daily as needed for pain. 02/01/17  Yes [provider]  sertraline (ZOLOFT) 50 MG tablet Take 50 mg by mouth daily. 02/28/17  Yes [provider]  XTAMPZA ER 9 MG C12A Take 9 mg by mouth 2 (two) times daily. 03/01/17  Yes [provider]    Family History History reviewed. No pertinent family history.  Social History Social History  Substance Use Topics  . Smoking status: Current Every Day Smoker    Packs/day: 1.00    Types: Cigarettes  . Smokeless tobacco: Not on file  . Alcohol use No     Allergies   Patient has no known allergies.   Review of Systems Review of Systems  Constitutional: Positive for fatigue. Negative for chills and fever.  Eyes: Negative for visual disturbance.  Respiratory: Positive for shortness of breath.   Cardiovascular: Negative for chest pain.  Gastrointestinal: Positive for abdominal pain and blood in stool. Negative for constipation, diarrhea, nausea and vomiting.  Skin: Positive for pallor.  Neurological: Positive  for light-headedness and headaches (chronic, unchanged).  All other systems reviewed and are negative.    Physical Exam Updated Vital Signs BP 130/67   Pulse 65   Temp 98.5 F (36.9 C) (Oral)   Resp 14   Ht 6\' 2"  (1.88 m)   Wt 122.5 kg (270 lb)   SpO2 100%   BMI 34.67 kg/m   Physical Exam  Constitutional: He appears well-developed and well-nourished. No distress.  Resting comfortably in bed  HENT:  Head: Normocephalic  and atraumatic.  Right Ear: External ear normal.  Left Ear: External ear normal.  Eyes: Pupils are equal, round, and reactive to light. Conjunctivae and EOM are normal. Right eye exhibits no discharge. Left eye exhibits no discharge.  Palpebral conjunctiva pale bilaterally  Neck: No JVD present. No tracheal deviation present.  Cardiovascular: Normal rate.   Pulmonary/Chest: Effort normal.  Abdominal: Soft. Bowel sounds are normal. He exhibits no distension. There is no tenderness.  Genitourinary:  Genitourinary Comments: Examination performed in the presence of a chaperone. No frank bleeding, no anal fissures, no tears noted. No external or internal hemorrhoids palpated. Prostate is not boggy or tender.  Musculoskeletal: He exhibits no edema.  Neurological: He is alert.  Skin: Skin is warm and dry. No erythema. There is pallor.  Psychiatric: He has a normal mood and affect. His behavior is normal.  Nursing note and vitals reviewed.    ED Treatments / Results  Labs (all labs ordered are listed, but only abnormal results are displayed) Labs Reviewed  COMPREHENSIVE METABOLIC PANEL - Abnormal; Notable for the following:       Result Value   CO2 21 (*)    Calcium 8.5 (*)    ALT 10 (*)    All other components within normal limits  CBC WITH DIFFERENTIAL/PLATELET - Abnormal; Notable for the following:    RBC 3.32 (*)    Hemoglobin 6.1 (*)    HCT 22.3 (*)    MCV 67.2 (*)    MCH 18.4 (*)    MCHC 27.4 (*)    RDW 20.8 (*)    All other components within normal limits  POC OCCULT BLOOD, ED - Abnormal; Notable for the following:    Fecal Occult Bld POSITIVE (*)    All other components within normal limits  CBC  BASIC METABOLIC PANEL  HIV ANTIBODY (ROUTINE TESTING)  POC OCCULT BLOOD, ED  TYPE AND SCREEN  ABO/RH  PREPARE RBC (CROSSMATCH)    EKG  EKG Interpretation None       Radiology No results found.  Procedures Procedures (including critical care time)  Medications  Ordered in ED Medications  albuterol (PROVENTIL HFA;VENTOLIN HFA) 108 (90 Base) MCG/ACT inhaler 1-2 puff (1 puff Inhalation Not Given 03/01/17 2018)  pantoprazole (PROTONIX) 80 mg in sodium chloride 0.9 % 100 mL IVPB (not administered)  pantoprazole (PROTONIX) 80 mg in sodium chloride 0.9 % 250 mL (0.32 mg/mL) infusion (not administered)  pantoprazole (PROTONIX) injection 40 mg (not administered)  albuterol (PROVENTIL HFA;VENTOLIN HFA) 108 (90 Base) MCG/ACT inhaler 1 puff (not administered)  atorvastatin (LIPITOR) tablet 20 mg (not administered)  gabapentin (NEURONTIN) tablet 600 mg (not administered)  sertraline (ZOLOFT) tablet 50 mg (not administered)  OxyCODONE ER C12A 9 mg (not administered)  OxyCODONE HCl (Abuse Deter) (OXAYDO) TABA 5 mg (not administered)  acetaminophen (TYLENOL) tablet 650 mg (not administered)    Or  acetaminophen (TYLENOL) suppository 650 mg (not administered)  ondansetron (ZOFRAN) tablet 4 mg (not administered)  Or  ondansetron [facility]) injection 4 mg (not administered)  0.9 %  sodium chloride infusion ( Intravenous New Bag/Given 03/01/17 1953)  famotidine (PEPCID) IVPB 20 mg premix (0 mg Intravenous Stopped 03/01/17 2028)     Initial Impression / Assessment and Plan / ED Course  I have reviewed the triage vital signs and the nursing notes.  Pertinent labs & imaging results that were available during my care of the patient were reviewed by me and considered in my medical decision making (see chart for details).    Patient with symptomatic anemia, found to have a hemoglobin of 6.1. Afebrile, vital signs are stable. Remainder of lab work is unremarkable. He is Hemoccult-positive, source appears to be from a GI bleed from persistent use of Goody's powder. Initiated type and screen, fluid resuscitation and blood transfusion as well as protonix for epigastric pain. Spoke with hospitalist Dr. Julian Reil who agrees to assume care of patient and bring him into the hospital  for further management and evaluation. Patient seen and evaluated by Dr.Nanavati who agrees with assessment and plan.  CRITICAL CARE Performed by: Jeanie Sewer   Total critical care time: 35 minutes  Critical care time was exclusive of separately billable procedures and treating other patients.  Critical care was necessary to treat or prevent imminent or life-threatening deterioration.  Critical care was time spent personally by me on the following activities: development of treatment plan with patient and/or surrogate as well as nursing, discussions with consultants, evaluation of patient's response to treatment, examination of patient, obtaining history from patient or surrogate, ordering and performing treatments and interventions, ordering and review of laboratory studies, ordering and review of radiographic studies, pulse oximetry and re-evaluation of patient's condition.   Final Clinical Impressions(s) / ED Diagnoses   Final diagnoses:  Symptomatic anemia    New Prescriptions New Prescriptions   No medications on file     Bennye Alm 03/01/17 2224    Derwood Kaplan, MD 03/06/17 [id number]

## 2017-03-02 ENCOUNTER — Encounter (HOSPITAL_COMMUNITY): Payer: Self-pay | Admitting: *Deleted

## 2017-03-02 ENCOUNTER — Encounter (HOSPITAL_COMMUNITY)
Admission: EM | Disposition: A | Discharge status: Home / Self Care | Payer: Self-pay | Source: Home / Self Care | Attending: Internal Medicine

## 2017-03-02 DIAGNOSIS — E785 Hyperlipidemia, unspecified: Secondary | ICD-10-CM

## 2017-03-02 DIAGNOSIS — D649 Anemia, unspecified: Secondary | ICD-10-CM

## 2017-03-02 DIAGNOSIS — F329 Major depressive disorder, single episode, unspecified: Secondary | ICD-10-CM

## 2017-03-02 DIAGNOSIS — M545 Low back pain: Secondary | ICD-10-CM

## 2017-03-02 DIAGNOSIS — K922 Gastrointestinal hemorrhage, unspecified: Secondary | ICD-10-CM

## 2017-03-02 DIAGNOSIS — E876 Hypokalemia: Secondary | ICD-10-CM

## 2017-03-02 HISTORY — PX: ESOPHAGOGASTRODUODENOSCOPY: SHX5428

## 2017-03-02 LAB — HIV ANTIBODY (ROUTINE TESTING W REFLEX): HIV Screen 4th Generation wRfx: NONREACTIVE

## 2017-03-02 LAB — BASIC METABOLIC PANEL
Anion gap: 10 (ref 5–15)
BUN: 5 mg/dL — ABNORMAL LOW (ref 6–20)
CO2: 23 mmol/L (ref 22–32)
Calcium: 8.6 mg/dL — ABNORMAL LOW (ref 8.9–10.3)
Chloride: 109 mmol/L (ref 101–111)
Creatinine, Ser: 0.63 mg/dL (ref 0.61–1.24)
GFR calc Af Amer: >60 mL/min (ref >60)
GFR calc non Af Amer: >60 mL/min (ref >60)
Glucose, Bld: 85 mg/dL (ref 65–99)
Potassium: 3.2 mmol/L — ABNORMAL LOW (ref 3.5–5.1)
Sodium: 142 mmol/L (ref 135–145)

## 2017-03-02 LAB — CBC
HCT: 24.8 % — ABNORMAL LOW (ref 39.0–52.0)
Hemoglobin: 7 g/dL — ABNORMAL LOW (ref 13.0–17.0)
MCH: 19.8 pg — ABNORMAL LOW (ref 26.0–34.0)
MCHC: 28.2 g/dL — ABNORMAL LOW (ref 30.0–36.0)
MCV: 70.1 fL — ABNORMAL LOW (ref 78.0–100.0)
Platelets: 373 10*3/uL (ref 150–400)
RBC: 3.54 MIL/uL — ABNORMAL LOW (ref 4.22–5.81)
RDW: 22.3 % — ABNORMAL HIGH (ref 11.5–15.5)
WBC: 6.9 10*3/uL (ref 4.0–10.5)

## 2017-03-02 LAB — PREPARE RBC (CROSSMATCH)

## 2017-03-02 SURGERY — EGD (ESOPHAGOGASTRODUODENOSCOPY)
Anesthesia: Moderate Sedation

## 2017-03-02 MED ORDER — PROMETHAZINE HCL 25 MG/ML IJ SOLN
12.5000 mg | Freq: Four times a day (QID) | INTRAMUSCULAR | Status: DC | PRN
  Administered 2017-03-02: 12.5 mg via INTRAVENOUS
  Filled 2017-03-02: qty 1

## 2017-03-02 MED ORDER — DIPHENHYDRAMINE HCL 50 MG/ML IJ SOLN
INTRAMUSCULAR | Status: DC | PRN
  Administered 2017-03-02: 25 mg via INTRAVENOUS

## 2017-03-02 MED ORDER — MIDAZOLAM HCL 10 MG/2ML IJ SOLN
INTRAMUSCULAR | Status: DC | PRN
  Administered 2017-03-02: 2 mg via INTRAVENOUS
  Administered 2017-03-02 (×3): 1 mg via INTRAVENOUS

## 2017-03-02 MED ORDER — FENTANYL CITRATE (PF) 100 MCG/2ML IJ SOLN
INTRAMUSCULAR | Status: DC | PRN
  Administered 2017-03-02 (×2): 25 ug via INTRAVENOUS

## 2017-03-02 MED ORDER — MIDAZOLAM HCL 5 MG/ML IJ SOLN
INTRAMUSCULAR | Status: AC
  Filled 2017-03-02: qty 2

## 2017-03-02 MED ORDER — POTASSIUM CHLORIDE CRYS ER 20 MEQ PO TBCR
40.0000 meq | EXTENDED_RELEASE_TABLET | Freq: Once | ORAL | Status: DC
  Filled 2017-03-02: qty 2

## 2017-03-02 MED ORDER — FENTANYL CITRATE (PF) 100 MCG/2ML IJ SOLN
INTRAMUSCULAR | Status: AC
  Filled 2017-03-02: qty 4

## 2017-03-02 MED ORDER — SODIUM CHLORIDE 0.9 % IV SOLN
INTRAVENOUS | Status: DC
  Administered 2017-03-02: 500 mL via INTRAVENOUS

## 2017-03-02 MED ORDER — BUTAMBEN-TETRACAINE-BENZOCAINE 2-2-14 % EX AERO
INHALATION_SPRAY | CUTANEOUS | Status: DC | PRN
  Administered 2017-03-02: 1 via TOPICAL

## 2017-03-02 MED ORDER — DIPHENHYDRAMINE HCL 50 MG/ML IJ SOLN
INTRAMUSCULAR | Status: AC
  Filled 2017-03-02: qty 1

## 2017-03-02 MED ORDER — BUTALBITAL-APAP-CAFFEINE 50-325-40 MG PO TABS
2.0000 | ORAL_TABLET | Freq: Once | ORAL | Status: AC
  Administered 2017-03-03: 2 via ORAL
  Filled 2017-03-02 (×2): qty 2

## 2017-03-02 NOTE — Progress Notes (Signed)
PROGRESS NOTE    Jack Greene  JXB:[id number] DOB: 1971-06-04 DOA: 03/01/2017 PCP: Farris Has, MD   Chief Complaint  Patient presents with  . Abnormal Lab  . Anemia     Brief Narrative:  HPI on 03/01/2017 by Dr. Lyda Perone Jack Greene is a 46 y.o. male with medical history significant of chronic back pain, takes large quantities of BC goody's powder 4-6 times daily in addition to other meds including indomethacin. Patient presents to the ED with c/o fatigue, generalized weakness.  Saw PCP, lab work showed HGB 6.2.  Patient called and sent in to ED. Patient endorses occasional dark stools, especially over last 3 days.  HGB was 14.x back in May of this year according to care everywhere. Occasional mild epigastric pain with eating. Assessment & Plan   Upper GI bleed/Symptomatic Anemia/Anemia of blood loss -Likely secondary to NSAID use -Patient was taking Marlin Canary and BC powders for back pain -Hemoglobin on admission was 6.1 -Patient transfused 2 units PRBC with hemoglobin 7, will order additional 2 units to be transfused today -Gastroenterology consulted and appreciated -Status post EGD: Normal air next, small hiatal hernia, nonbleeding gastric ulcers with no stigmata of bleeding. Acute gastritis, biopsied. Normal duodenal bulb, first second and third portion of duodenum. -Gastroneurology recommended clear liquid diet with slow advancement. Restart iron, avoid NSAIDs. Continue Protonix 40 mg BID x 3 months. Return to GI clinic in 4 weeks, with repeat endoscopy in 2-3 months. May need treatment for H. pylori if positive. -Will continue to monitor patient and CBC.  Hypokalemia -Will replace. Monitor BMP  Chronic pain -Patient sees pain management physician, and will need outpatient MRI. -Continue gabapentin and pain control  Depression -Continue Zoloft  Hyperlipidemia -Continue Lipitor  DVT Prophylaxis  SCDS  Code Status: Full  Family Communication: None  at bedside  Disposition Plan: Admitted. Pending improvement in hemoglobin   Consultants Gastroenterology  Procedures  EGD  Antibiotics   Anti-infectives    None      Subjective:   Jack Greene seen and examined today.  Continues to complain of back pain. States he was having dark stools tarry at times. Thought it was secondary to his iron and diet. Denies any abdominal pain, nausea or vomiting, diarrhea or constipation, dizziness or headache.    Objective:   Vitals:   03/02/17 1430 03/02/17 1434 03/02/17 1440 03/02/17 1450  BP: (!) 117/51 (!) 102/35 (!) 96/39 (!) 109/40  Pulse: 80 77 73 70  Resp: 15 15 14 14   Temp:  97.9 F (36.6 C)    TempSrc:  Oral    SpO2: 96% 95% 95% 94%  Weight:      Height:        Intake/Output Summary (Last 24 hours) at 03/02/17 1536 Last data filed at 03/02/17 1308  Gross per 24 hour  Intake          1030.67 ml  Output                0 ml  Net          1030.67 ml   Filed Weights   03/01/17 1727  Weight: 122.5 kg (270 lb)    Exam  General: Well developed, well nourished, NAD, appears stated age  HEENT: NCAT, mucous membranes moist.   Cardiovascular: S1 S2 auscultated, RRR, no murmurs  Respiratory: Clear to auscultation bilaterally with equal chest rise  Abdomen: Soft, obese, nontender, nondistended, + bowel sounds  Extremities: warm dry without cyanosis clubbing  or edema  Neuro: AAOx3, nonfocal  Psych: appropriate   Data Reviewed: I have personally reviewed following labs and imaging studies  CBC:  Recent Labs Lab 03/01/17 1733 03/02/17 0549  WBC 6.4 6.9  NEUTROABS 4.2  --   HGB 6.1* 7.0*  HCT 22.3* 24.8*  MCV 67.2* 70.1*  PLT 364 373   Basic Metabolic Panel:  Recent Labs Lab 03/01/17 1733 03/02/17 0549  NA 141 142  K 3.5 3.2*  CL 109 109  CO2 21* 23  GLUCOSE 94 85  BUN 6 5*  CREATININE 0.69 0.63  CALCIUM 8.5* 8.6*   GFR: Estimated Creatinine Clearance: 157 mL/min (by C-G formula based on  SCr of 0.63 mg/dL). Liver Function Tests:  Recent Labs Lab 03/01/17 1733  AST 18  ALT 10*  ALKPHOS 53  BILITOT 0.3  PROT 6.5  ALBUMIN 3.7   No results for input(s): LIPASE, AMYLASE in the last 168 hours. No results for input(s): AMMONIA in the last 168 hours. Coagulation Profile: No results for input(s): INR, PROTIME in the last 168 hours. Cardiac Enzymes: No results for input(s): CKTOTAL, CKMB, CKMBINDEX, TROPONINI in the last 168 hours. BNP (last 3 results) No results for input(s): PROBNP in the last 8760 hours. HbA1C: No results for input(s): HGBA1C in the last 72 hours. CBG: No results for input(s): GLUCAP in the last 168 hours. Lipid Profile: No results for input(s): CHOL, HDL, LDLCALC, TRIG, CHOLHDL, LDLDIRECT in the last 72 hours. Thyroid Function Tests: No results for input(s): TSH, T4TOTAL, FREET4, T3FREE, THYROIDAB in the last 72 hours. Anemia Panel: No results for input(s): VITAMINB12, FOLATE, FERRITIN, TIBC, IRON, RETICCTPCT in the last 72 hours. Urine analysis:    Component Value Date/Time   COLORURINE YELLOW 04/29/2013 1244   APPEARANCEUR CLEAR 04/29/2013 1244   LABSPEC 1.029 04/29/2013 1244   PHURINE 5.5 04/29/2013 1244   GLUCOSEU NEGATIVE 04/29/2013 1244   HGBUR NEGATIVE 04/29/2013 1244   BILIRUBINUR NEGATIVE 04/29/2013 1244   KETONESUR 15 (A) 04/29/2013 1244   PROTEINUR NEGATIVE 04/29/2013 1244   UROBILINOGEN 0.2 04/29/2013 1244   NITRITE NEGATIVE 04/29/2013 1244   LEUKOCYTESUR NEGATIVE 04/29/2013 1244   Sepsis Labs: @LABRCNTIP (procalcitonin:4,lacticidven:4)  )No results found for this or any previous visit (from the past 240 hour(s)).    Radiology Studies: No results found.   Scheduled Meds: . atorvastatin  20 mg Oral Daily  . gabapentin  600 mg Oral TID  . oxyCODONE  10 mg Oral BID  . [START ON 03/05/2017] pantoprazole  40 mg Intravenous Q12H  . sertraline  50 mg Oral Daily   Continuous Infusions:   LOS: 1 day   Time Spent in  minutes   30 minutes  Brecklyn Galvis D.O. on 03/02/2017 at 3:36 PM  Between 7am to 7pm - Pager - [phone]  After 7pm go to [email or web address] - password [email or web address]  And look for the night coverage person covering for me after hours  [facility] Office  [phone]

## 2017-03-02 NOTE — Consult Note (Signed)
Reason for Consult: Anemia guaiac positivity Referring Physician: Hospital team  Jack Greene is an 46 y.o. male.  HPI: Patient seen and examined and his hospital computer chart and our [place] office computer chart was reviewed and he has not had much GI symptoms over the years and no previous GI procedures and no GI problems run in the family and he does have a history of iron deficiency in the past but they never told him why he was treated with iron for a while and up until recently has last few hemoglobins have been okay and he has been on lots of aspirin and nonsteroidals for headaches and back pain and his bowels are occasionally dark and his never seen bright red or maroon blood and he has no other complaints  Past Medical History:  Diagnosis Date  . Asthma   . Hyperlipidemia   . Hypertension   . Migraines     Past Surgical History:  Procedure Laterality Date  . BACK SURGERY    . TONSILLECTOMY      History reviewed. No pertinent family history.  Social History:  reports that he has been smoking Cigarettes.  He has been smoking about 1.00 pack per day. He does not have any smokeless tobacco history on file. He reports that he does not drink alcohol or use drugs.  Allergies: No Known Allergies  Medications: I have reviewed the patient's current medications.  Results for orders placed or performed during the hospital encounter of 03/01/17 (from the past 48 hour(s))  Comprehensive metabolic panel     Status: Abnormal   Collection Time: 03/01/17  5:33 PM  Result Value Ref Range   Sodium 141 135 - 145 mmol/L   Potassium 3.5 3.5 - 5.1 mmol/L   Chloride 109 101 - 111 mmol/L   CO2 21 (L) 22 - 32 mmol/L   Glucose, Bld 94 65 - 99 mg/dL   BUN 6 6 - 20 mg/dL   Creatinine, Ser 0.69 0.61 - 1.24 mg/dL   Calcium 8.5 (L) 8.9 - 10.3 mg/dL   Total Protein 6.5 6.5 - 8.1 g/dL   Albumin 3.7 3.5 - 5.0 g/dL   AST 18 15 - 41 U/L   ALT 10 (L) 17 - 63 U/L   Alkaline Phosphatase 53 38 -  126 U/L   Total Bilirubin 0.3 0.3 - 1.2 mg/dL   GFR calc non Af Amer >60 >60 mL/min   GFR calc Af Amer >60 >60 mL/min    Comment: (NOTE) The eGFR has been calculated using the CKD EPI equation. This calculation has not been validated in all clinical situations. eGFR's persistently <60 mL/min signify possible Chronic Kidney Disease.    Anion gap 11 5 - 15  CBC with Differential     Status: Abnormal   Collection Time: 03/01/17  5:33 PM  Result Value Ref Range   WBC 6.4 4.0 - 10.5 K/uL   RBC 3.32 (L) 4.22 - 5.81 MIL/uL   Hemoglobin 6.1 (LL) 13.0 - 17.0 g/dL    Comment: REPEATED TO VERIFY CRITICAL RESULT CALLED TO, READ BACK BY AND VERIFIED WITH: J EASLEY,RN 1819 03/01/2017 D BRADLEY    HCT 22.3 (L) 39.0 - 52.0 %   MCV 67.2 (L) 78.0 - 100.0 fL   MCH 18.4 (L) 26.0 - 34.0 pg   MCHC 27.4 (L) 30.0 - 36.0 g/dL   RDW 20.8 (H) 11.5 - 15.5 %   Platelets 364 150 - 400 K/uL   Neutrophils Relative % 66 %  Lymphocytes Relative 19 %   Monocytes Relative 6 %   Eosinophils Relative 8 %   Basophils Relative 1 %   Band Neutrophils 0 %   Metamyelocytes Relative 0 %   Myelocytes 0 %   Promyelocytes Absolute 0 %   Blasts 0 %   nRBC 0 0 /100 WBC   Other 0 %   Neutro Abs 4.2 1.7 - 7.7 K/uL   Lymphs Abs 1.2 0.7 - 4.0 K/uL   Monocytes Absolute 0.4 0.1 - 1.0 K/uL   Eosinophils Absolute 0.5 0.0 - 0.7 K/uL   Basophils Absolute 0.1 0.0 - 0.1 K/uL   RBC Morphology ELLIPTOCYTES     Comment: POLYCHROMASIA PRESENT  Type and screen Power     Status: None (Preliminary result)   Collection Time: 03/01/17  5:35 PM  Result Value Ref Range   ABO/RH(D) B POS    Antibody Screen NEG    Sample Expiration 03/04/2017    Unit Number [id number]    Blood Component Type RED CELLS,LR    Unit division 00    Status of Unit ISSUED,FINAL    Transfusion Status OK TO TRANSFUSE    Crossmatch Result Compatible    Unit Number [id number]    Blood Component Type RED CELLS,LR    Unit division  00    Status of Unit ISSUED    Transfusion Status OK TO TRANSFUSE    Crossmatch Result Compatible    Unit Number [id number]    Blood Component Type RED CELLS,LR    Unit division 00    Status of Unit ISSUED    Transfusion Status OK TO TRANSFUSE    Crossmatch Result Compatible    Unit Number [id number]    Blood Component Type RED CELLS,LR    Unit division 00    Status of Unit ALLOCATED    Transfusion Status OK TO TRANSFUSE    Crossmatch Result Compatible   ABO/Rh     Status: None   Collection Time: 03/01/17  5:35 PM  Result Value Ref Range   ABO/RH(D) B POS   Prepare RBC     Status: None   Collection Time: 03/01/17  7:33 PM  Result Value Ref Range   Order Confirmation ORDER PROCESSED BY BLOOD BANK   POC occult blood, ED     Status: Abnormal   Collection Time: 03/01/17  8:16 PM  Result Value Ref Range   Fecal Occult Bld POSITIVE (A) NEGATIVE  CBC     Status: Abnormal   Collection Time: 03/02/17  5:49 AM  Result Value Ref Range   WBC 6.9 4.0 - 10.5 K/uL   RBC 3.54 (L) 4.22 - 5.81 MIL/uL   Hemoglobin 7.0 (L) 13.0 - 17.0 g/dL   HCT 24.8 (L) 39.0 - 52.0 %   MCV 70.1 (L) 78.0 - 100.0 fL   MCH 19.8 (L) 26.0 - 34.0 pg   MCHC 28.2 (L) 30.0 - 36.0 g/dL   RDW 22.3 (H) 11.5 - 15.5 %   Platelets 373 150 - 400 K/uL  Basic metabolic panel     Status: Abnormal   Collection Time: 03/02/17  5:49 AM  Result Value Ref Range   Sodium 142 135 - 145 mmol/L   Potassium 3.2 (L) 3.5 - 5.1 mmol/L   Chloride 109 101 - 111 mmol/L   CO2 23 22 - 32 mmol/L   Glucose, Bld 85 65 - 99 mg/dL   BUN 5 (L) 6 - 20 mg/dL   Creatinine, Ser  0.63 0.61 - 1.24 mg/dL   Calcium 8.6 (L) 8.9 - 10.3 mg/dL   GFR calc non Af Amer >60 >60 mL/min   GFR calc Af Amer >60 >60 mL/min    Comment: (NOTE) The eGFR has been calculated using the CKD EPI equation. This calculation has not been validated in all clinical situations. eGFR's persistently <60 mL/min signify possible Chronic Kidney Disease.    Anion gap 10  5 - 15  Prepare RBC     Status: None   Collection Time: 03/02/17  7:22 AM  Result Value Ref Range   Order Confirmation ORDER PROCESSED BY BLOOD BANK     No results found.  ROS negative except above Blood pressure (!) 116/48, pulse 66, temperature 98.1 F (36.7 C), temperature source Oral, resp. rate 11, height _0  (1.88 m), weight 122.5 kg (270 lb), SpO2 97 %. Physical Exam vital signs stable afebrile no acute distress exam please see preassessment evaluation BUN and creatinine normal posttransfusion hemoglobin 7 MCV 70  Assessment/Plan: Iron deficiency guaiac positivity in a patient on lots of aspirin and nonsteroidals Plan: The risks benefits methods of endoscopy was discussed with the patient and will proceed this afternoon with further workup and plans pending those findings  [place] 03/02/2017, 2:01 PM

## 2017-03-02 NOTE — Progress Notes (Signed)
Pt admitted from ED per stretcher accompanied by a nurse tech on arrival to the floor pt alert and oriented self introduced to pt, oriented to pt care equipment and room, ID bracelet verified, skin assessment done, fall risk assessment done, vital sign are stable, second unit of RBC given by myself, first unit of blood given at ED by a nurse called Rushie Goltz but the order was still showing 2u of RBC need to be released, after  I released one already , per ED nurse blood order was release before given, I called Dr Julian Reil to verified order said pt was ordered to have 2u RBC,  Even though the two unit of RBC given but it is still showing 1U remaining to be given, pt not ordered to have 3u, will continue to monitor,

## 2017-03-02 NOTE — Op Note (Signed)
[facility] Patient Name: Jack Greene Procedure Date : 03/02/2017 MRN: [id number] Attending MD: Vida Rigger , MD Date of Birth: 09-Jan-1971 CSN: [id number] Age: 46 Admit Type: Inpatient Procedure:                Upper GI endoscopy Indications:              Iron deficiency anemia secondary to chronic blood                            loss, Heme positive stool Providers:                Vida Rigger, MD, Dwain Sarna, RN, Zoila Shutter,                            Technician Referring MD:              Medicines:                Fentanyl 50 micrograms IV, Midazolam 5 mg IV,                            Diphenhydramine 25 mg IV, Cetacaine spray Complications:            No immediate complications. Estimated Blood Loss:     Estimated blood loss: none. Procedure:                Pre-Anesthesia Assessment:                           - Prior to the procedure, a History and Physical                            was performed, and patient medications and                            allergies were reviewed. The patient's tolerance of                            previous anesthesia was also reviewed. The risks                            and benefits of the procedure and the sedation                            options and risks were discussed with the patient.                            All questions were answered, and informed consent                            was obtained. Prior Anticoagulants: The patient has                            taken aspirin, last dose was 1 day prior to  procedure. ASA Grade Assessment: II - A patient                            with mild systemic disease. After reviewing the                            risks and benefits, the patient was deemed in                            satisfactory condition to undergo the procedure.                           After obtaining informed consent, the endoscope was                            passed under  direct vision. Throughout the                            procedure, the patient's blood pressure, pulse, and                            oxygen saturations were monitored continuously. The                            [id number] ([id number]) scope was introduced through the                            mouth, and advanced to the second part of duodenum.                            The upper GI endoscopy was accomplished without                            difficulty. The patient tolerated the procedure                            fairly well. Scope In: Scope Out: Findings:      The larynx was normal.      A small hiatal hernia was present.      Few non-bleeding cratered and superficial gastric ulcers with no       stigmata of bleeding were found in the gastric antrum, in the prepyloric       region of the stomach and at the pylorus. there was some deformity of       the peripyloric area as well and some deformity and edema in the       proximal bulb      Patchy mild inflammation characterized by erythema was found in the       entire examined stomach. Biopsies were taken with a cold forceps for       histology of the antrum and the fundus to rule out H. pylori.      The duodenal bulb, first portion of the duodenum, second portion of the       duodenum and third portion of the duodenum were normal.      The  exam was otherwise without abnormality. Impression:               - Normal larynx.                           - Small hiatal hernia.                           - Non-bleeding gastric ulcers with no stigmata of                            bleeding.                           - Acute gastritis. Biopsied.                           - Normal duodenal bulb, first portion of the                            duodenum, second portion of the duodenum and third                            portion of the duodenum.                           - The examination was otherwise normal. Moderate Sedation:      Moderate  (conscious) sedation was administered by the endoscopy nurse       and supervised by the endoscopist. The following parameters were       monitored: oxygen saturation, heart rate, blood pressure, respiratory       rate, EKG, adequacy of pulmonary ventilation, and response to care. Recommendation:           - Patient has a contact number available for                            emergencies. The signs and symptoms of potential                            delayed complications were discussed with the                            patient. Return to normal activities tomorrow.                            Written discharge instructions were provided to the                            patient.                           - Clear liquid diet today. may slowly advance                            tomorrow and hopefully home soon restart iron soon                           -  No ibuprofen, naproxen, or other non-steroidal                            anti-inflammatory drugs long-term.                           - Use Protonix (pantoprazole) 40 mg PO BID for 3                            months. and chronically if he is going to need                            aspirin and nonsteroidals in the future                           - Return to GI clinic in 4 weeks. probably repeat                            endoscopy in 2-3 months to document healing                           - Telephone GI clinic if symptomatic PRN. treat H.                            pylori if positive at some point in the future Procedure Code(s):        --- Professional ---                           [phone], Esophagogastroduodenoscopy, flexible,                            transoral; with biopsy, single or multiple Diagnosis Code(s):        --- Professional ---                           K44.9, Diaphragmatic hernia without obstruction or                            gangrene                           K25.9, Gastric ulcer, unspecified as acute or                             chronic, without hemorrhage or perforation                           K29.00, Acute gastritis without bleeding                           D50.0, Iron deficiency anemia secondary to blood                            loss (chronic)  R19.5, Other fecal abnormalities CPT copyright 2016 American Medical Association. All rights reserved. The codes documented in this report are preliminary and upon coder review may  be revised to meet current compliance requirements. Vida Rigger, MD 03/02/2017 2:29:03 PM This report has been signed electronically. Number of Addenda: 0

## 2017-03-03 LAB — TYPE AND SCREEN
ABO/RH(D): B POS
Antibody Screen: NEGATIVE
Unit division: 0
Unit division: 0
Unit division: 0
Unit division: 0

## 2017-03-03 LAB — CBC WITH DIFFERENTIAL/PLATELET
Basophils Absolute: 0.1 10*3/uL (ref 0.0–0.1)
Basophils Relative: 1 %
Eosinophils Absolute: 0.2 10*3/uL (ref 0.0–0.7)
Eosinophils Relative: 2 %
HCT: 30.5 % — ABNORMAL LOW (ref 39.0–52.0)
Hemoglobin: 9 g/dL — ABNORMAL LOW (ref 13.0–17.0)
Lymphocytes Relative: 19 %
Lymphs Abs: 1.8 10*3/uL (ref 0.7–4.0)
MCH: 20.8 pg — ABNORMAL LOW (ref 26.0–34.0)
MCHC: 29.5 g/dL — ABNORMAL LOW (ref 30.0–36.0)
MCV: 70.4 fL — ABNORMAL LOW (ref 78.0–100.0)
Monocytes Absolute: 0.9 10*3/uL (ref 0.1–1.0)
Monocytes Relative: 9 %
Neutro Abs: 6.6 10*3/uL (ref 1.7–7.7)
Neutrophils Relative %: 69 %
Platelets: 384 10*3/uL (ref 150–400)
RBC: 4.33 MIL/uL (ref 4.22–5.81)
RDW: 21.2 % — ABNORMAL HIGH (ref 11.5–15.5)
WBC: 9.6 10*3/uL (ref 4.0–10.5)

## 2017-03-03 LAB — BASIC METABOLIC PANEL
Anion gap: 10 (ref 5–15)
BUN: <5 mg/dL — ABNORMAL LOW (ref 6–20)
CO2: 23 mmol/L (ref 22–32)
Calcium: 8.8 mg/dL — ABNORMAL LOW (ref 8.9–10.3)
Chloride: 105 mmol/L (ref 101–111)
Creatinine, Ser: 0.66 mg/dL (ref 0.61–1.24)
GFR calc Af Amer: >60 mL/min (ref >60)
GFR calc non Af Amer: >60 mL/min (ref >60)
Glucose, Bld: 86 mg/dL (ref 65–99)
Potassium: 3.1 mmol/L — ABNORMAL LOW (ref 3.5–5.1)
Sodium: 138 mmol/L (ref 135–145)

## 2017-03-03 LAB — BPAM RBC
Blood Product Expiration Date: 201808302359
Blood Product Expiration Date: 201808302359
Blood Product Expiration Date: 201808312359
Blood Product Expiration Date: 201809132359
ISSUE DATE / TIME: 201808232021
ISSUE DATE / TIME: 201808240109
ISSUE DATE / TIME: 201808241016
ISSUE DATE / TIME: 201808241603
Unit Type and Rh: 1700
Unit Type and Rh: 7300
Unit Type and Rh: 7300
Unit Type and Rh: 7300

## 2017-03-03 MED ORDER — BUTALBITAL-APAP-CAFFEINE 50-325-40 MG PO TABS
2.0000 | ORAL_TABLET | Freq: Once | ORAL | Status: AC
  Administered 2017-03-03: 2 via ORAL
  Filled 2017-03-03: qty 2

## 2017-03-03 MED ORDER — POTASSIUM CHLORIDE CRYS ER 20 MEQ PO TBCR
40.0000 meq | EXTENDED_RELEASE_TABLET | Freq: Once | ORAL | Status: AC
  Administered 2017-03-03: 40 meq via ORAL
  Filled 2017-03-03: qty 2

## 2017-03-03 MED ORDER — PANTOPRAZOLE SODIUM 40 MG PO TBEC: 40.0000 mg | DELAYED_RELEASE_TABLET | Freq: Two times a day (BID) | ORAL | 2 refills | Status: AC

## 2017-03-03 NOTE — Progress Notes (Signed)
Jack Greene to be D/C'd Home per MD order.  Discussed with the patient and all questions fully answered.  VSS, Skin clean, dry and intact without evidence of skin break down, no evidence of skin tears noted. IV catheter discontinued intact. Site without signs and symptoms of complications. Dressing and pressure applied.  An After Visit Summary was printed and given to the patient. Patient received prescription.  D/c education completed with patient/family including follow up instructions, medication list, d/c activities limitations if indicated, with other d/c instructions as indicated by MD - patient able to verbalize understanding, all questions fully answered.   Patient instructed to return to ED, call 911, or call MD for any changes in condition.   Patient escorted by nurse , and D/C home via private auto.  [name] 03/03/2017 12:43 PM

## 2017-03-03 NOTE — Discharge Summary (Signed)
Physician Discharge Summary  Jack Greene QBV:[id number] DOB: 10-24-1970 DOA: 03/01/2017  PCP: Jack Has, MD  Admit date: 03/01/2017 Discharge date: 03/03/2017  Time spent: 45 minutes  Recommendations for Outpatient Follow-up:  Patient will be discharged to home.  Patient will need to follow up with primary care provider within one week of discharge, repeat CBC and BMP in one week.  Follow up with Dr. Ewing Greene, gastroenterologist, in 4 weeks.  Patient should continue medications as prescribed.  Patient should follow a full liquid/soft diet and advance slowly. Avoid NSAIDS.  Discharge Diagnoses:  Upper GI bleed/Symptomatic Anemia/Anemia of blood loss Hypokalemia Chronic pain Depression Hyperlipidemia  Discharge Condition: Stable  Diet recommendation: Liquid/soft diet, advance slowly   Filed Weights   03/01/17 1727  Weight: 122.5 kg (270 lb)    History of present illness:  on 03/01/2017 by Jack Greene a 46 y.o.malewith medical history significant of chronic back pain, takes large quantities of BC goody's powder 4-6 times daily in addition to other meds including indomethacin. Patient presents to the ED with c/o fatigue, generalized weakness. Saw PCP, lab work showed HGB 6.2. Patient called and sent in to ED. Patient endorses occasional dark stools, especially over last 3 days. HGB was 14.x back in May of this year according to care everywhere. Occasional mild epigastric pain with eating.  Hospital Course:  Upper GI bleed/Symptomatic Anemia/Anemia of blood loss -Likely secondary to NSAID use -Patient was taking Jack Greene and BC powders for back pain -Hemoglobin on admission was 6.1 -Transfused 4 units PRBC during this admission -Gastroenterology consulted and appreciated -Status post EGD: Normal air next, small hiatal hernia, nonbleeding gastric ulcers with no stigmata of bleeding. Acute gastritis, biopsied. Normal duodenal bulb, first  second and third portion of duodenum. -Gastroneurology recommended clear liquid diet with slow advancement. Restart iron, avoid NSAIDs. Continue Protonix 40 mg BID x 3 months. Return to GI clinic in 4 weeks, with repeat endoscopy in 2-3 months. May need treatment for H. pylori if positive. -Biopsy results pending, and may be followed by GI -Hemoglobin stable, currently 9 -repeat CBC in one week  Hypokalemia -replaced, repeat BMP in one week  Chronic pain -Patient sees pain management physician, and will need outpatient MRI. -Continue gabapentin and pain control (home regimen)  Depression -Continue Zoloft  Hyperlipidemia -Continue Lipitor  Tobacco abuse -Discussed smoking cessation -Declined nicotine patch   Procedures:EGD  Consultations:Gastroenterology  Discharge Exam: Vitals:   03/02/17 2231 03/03/17 0651  BP: 140/61 (!) 121/49  Pulse: 68 63  Resp: 16 16  Temp: 99.3 F (37.4 C) 99.3 F (37.4 C)  SpO2: 100% 95%   Patient feeling better today. Able to tolerate liquids without abdominal pain or nausea. Denies chest pain, shortness of breath, dizziness, headache.    General: Well developed, well nourished, NAD, appears stated age  HEENT: NCAT, mucous membranes moist.  Cardiovascular: S1 S2 auscultated,RRR, no murmurs  Respiratory: Clear to auscultation bilaterally with equal chest rise  Abdomen: Soft, obese, nontender, nondistended, + bowel sounds  Extremities: warm dry without cyanosis clubbing or edema  Neuro: AAOx3, nonfocal  Psych: Pleasant and appropriate  Discharge Instructions Discharge Instructions    Discharge instructions    Complete by:  As directed    Patient will be discharged to home.  Patient will need to follow up with primary care provider within one week of discharge, repeat CBC and BMP in one week.  Follow up with Dr. Ewing Greene, gastroenterologist, in 4 weeks.  Patient should continue  medications as prescribed.  Patient should follow a  full liquid/soft diet and advance slowly. Avoid NSAIDS.     Current Discharge Medication List    START taking these medications   Details  pantoprazole (PROTONIX) 40 MG tablet Take 1 tablet (40 mg total) by mouth 2 (two) times daily. Qty: 60 tablet, Refills: 2      CONTINUE these medications which have NOT CHANGED   Details  albuterol (PROVENTIL HFA;VENTOLIN HFA) 108 (90 BASE) MCG/ACT inhaler Inhale 1 puff into the lungs every 6 (six) hours as needed for wheezing.    atorvastatin (LIPITOR) 20 MG tablet Take 20 mg by mouth daily.    gabapentin (NEURONTIN) 600 MG tablet Take 600 mg by mouth 3 (three) times daily.     OXAYDO 5 MG TABA Take 5 mg by mouth 3 (three) times daily as needed for pain. Refills: 0    sertraline (ZOLOFT) 50 MG tablet Take 50 mg by mouth daily. Refills: 1    XTAMPZA ER 9 MG C12A Take 9 mg by mouth 2 (two) times daily. Refills: 0       No Known Allergies Follow-up Information    Jack Has, MD. Schedule an appointment as soon as possible for a visit in 1 week(s).   Specialty:  Family Medicine Why:  Hospital follow up Contact information: [address] [place] [address] [phone]        Jack Rigger, MD. Schedule an appointment as soon as possible for a visit in 4 week(s).   Specialty:  Gastroenterology Why:  Hospital follow up, bleeding Contact information: [address] [place] [address] [phone]            The results of significant diagnostics from this hospitalization (including imaging, microbiology, ancillary and laboratory) are listed below for reference.    Significant Diagnostic Studies: No results found.  Microbiology: No results found for this or any previous visit (from the past 240 hour(s)).   Labs: Basic Metabolic Panel:  Recent Labs Lab 03/01/17 1733 03/02/17 0549 03/03/17 0712  NA 141 142 138  K 3.5 3.2* 3.1*  CL 109 109 105  CO2 21* 23 23  GLUCOSE 94 85  86  BUN 6 5* <5*  CREATININE 0.69 0.63 0.66  CALCIUM 8.5* 8.6* 8.8*   Liver Function Tests:  Recent Labs Lab 03/01/17 1733  AST 18  ALT 10*  ALKPHOS 53  BILITOT 0.3  PROT 6.5  ALBUMIN 3.7   No results for input(s): LIPASE, AMYLASE in the last 168 hours. No results for input(s): AMMONIA in the last 168 hours. CBC:  Recent Labs Lab 03/01/17 1733 03/02/17 0549 03/03/17 0712  WBC 6.4 6.9 9.6  NEUTROABS 4.2  --  PENDING  HGB 6.1* 7.0* 9.0*  HCT 22.3* 24.8* 30.5*  MCV 67.2* 70.1* 70.4*  PLT 364 373 384   Cardiac Enzymes: No results for input(s): CKTOTAL, CKMB, CKMBINDEX, TROPONINI in the last 168 hours. BNP: BNP (last 3 results) No results for input(s): BNP in the last 8760 hours.  ProBNP (last 3 results) No results for input(s): PROBNP in the last 8760 hours.  CBG: No results for input(s): GLUCAP in the last 168 hours.     SignedEdsel Petrin  [facility] 03/03/2017, 10:44 AM

## 2017-03-03 NOTE — Discharge Instructions (Signed)
Gastrointestinal Bleeding °Gastrointestinal (GI) bleeding is bleeding somewhere along the digestive tract, between the mouth and anus. This can be caused by various problems. The severity of these problems can range from mild to serious or even life-threatening. If you have GI bleeding, you may find blood in your stools (feces), you may have black stools, or you may vomit blood. If there is a lot of bleeding, you may need to stay in the hospital. °What are the causes? °This condition may be caused by: °· Esophagitis. This is inflammation, irritation, or swelling of the esophagus. °· Hemorrhoids. These are swollen veins in the rectum. °· Anal fissures. These are areas of painful tearing that are often caused by passing hard stool. °· Diverticulosis. These are pouches that form on the colon over time, with age, and may bleed a lot. °· Diverticulitis. This is inflammation in areas with diverticulosis. It can cause pain, fever, and bloody stools, although bleeding may be mild. °· Polyps and cancer. Colon cancer often starts out as precancerous polyps. °· Gastritis and ulcers. With these, bleeding may come from the upper GI tract, near the stomach. ° °What are the signs or symptoms? °Symptoms of this condition may include: °· Bright red blood in your vomit, or vomit that looks like coffee grounds. °· Bloody, black, or tarry stools. °? Bleeding from the lower GI tract will usually cause red or maroon blood in the stools. °? Bleeding from the upper GI tract may cause black, tarry, often bad-smelling stools. °? In certain cases, if the bleeding is fast enough, the stools may be red. °· Pain or cramping in the abdomen. ° °How is this diagnosed? °This condition may be diagnosed based on: °· Medical history and physical exam. °· Various tests, such as: °? Blood tests. °? X-rays and other imaging tests. °? Esophagogastroduodenoscopy (EGD). In this test, a flexible, lighted tube is used to look at your esophagus, stomach, and  small intestine. °? Colonoscopy. In this test, a flexible, lighted tube is used to look at your colon. ° °How is this treated? °Treatment for this condition depends on the cause of the bleeding. For example: °· For bleeding from the esophagus, stomach, small intestine, or colon, the health care provider doing your EGD or colonoscopy may be able to stop the bleeding as part of the procedure. °· Inflammation or infection of the colon can be treated with medicines. °· Certain rectal problems can be treated with creams, suppositories, or warm baths. °· Surgery is sometimes needed. °· Blood transfusions are sometimes needed if a lot of blood has been lost. ° °If bleeding is slow, you may be allowed to go home. If there is a lot of bleeding, you will need to stay in the hospital for observation. °Follow these instructions at home: °· Take over-the-counter and prescription medicines only as told by your health care provider. °· Eat foods that are high in fiber. This will help to keep your stools soft. These foods include whole grains, legumes, fruits, and vegetables. Eating 1-3 prunes each day works well for many people. °· Drink enough fluid to keep your urine clear or pale yellow. °· Keep all follow-up visits as told by your health care provider. This is important. °Contact a health care provider if: °· Your symptoms do not improve. °Get help right away if: °· Your bleeding increases. °· You feel light-headed or you faint. °· You feel weak. °· You have severe cramps in your back or abdomen. °· You pass large blood clots in your stool. °·   Your symptoms are getting worse. This information is not intended to replace advice given to you by your health care provider. Make sure you discuss any questions you have with your health care provider. Document Released: 06/23/2000 Document Revised: 11/24/2015 Document Reviewed: 12/14/2014 Elsevier Interactive Patient Education  2018 [employer].  Food Choices for Peptic Ulcer  Disease When you have peptic ulcer disease, the foods you eat and your eating habits are very important. Choosing the right foods can help ease the discomfort of peptic ulcer disease. What general guidelines do I need to follow?  Choose fruits, vegetables, whole grains, and low-fat meat, fish, and poultry.  Keep a food diary to identify foods that cause symptoms.  Avoid foods that cause irritation or pain. These may be different for different people.  Eat frequent small meals instead of three large meals each day. The pain may be worse when your stomach is empty.  Avoid eating close to bedtime. What foods are not recommended? The following are some foods and drinks that may worsen your symptoms:  Black, white, and red pepper.  Hot sauce.  Chili peppers.  Chili powder.  Chocolate and cocoa.  Alcohol.  Tea, coffee, and cola (regular and decaffeinated).  The items listed above may not be a complete list of foods and beverages to avoid. Contact your dietitian for more information. This information is not intended to replace advice given to you by your health care provider. Make sure you discuss any questions you have with your health care provider. Document Released: 09/18/2011 Document Revised: 12/02/2015 Document Reviewed: 04/30/2013 Elsevier Interactive Patient Education  2017 [employer].

## 2017-03-05 ENCOUNTER — Other Ambulatory Visit: Payer: Self-pay | Admitting: Pain Medicine

## 2017-03-05 ENCOUNTER — Encounter (HOSPITAL_COMMUNITY): Payer: Self-pay | Admitting: Gastroenterology

## 2017-03-05 DIAGNOSIS — M545 Low back pain: Secondary | ICD-10-CM

## 2017-03-15 ENCOUNTER — Ambulatory Visit
Admission: RE | Admit: 2017-03-15 | Discharge: 2017-03-15 | Disposition: A | Discharge status: Home / Self Care | Payer: BLUE CROSS/BLUE SHIELD | Source: Ambulatory Visit | Attending: Pain Medicine | Admitting: Pain Medicine

## 2017-03-15 DIAGNOSIS — M545 Low back pain: Secondary | ICD-10-CM

## 2017-05-25 ENCOUNTER — Ambulatory Visit: Payer: BLUE CROSS/BLUE SHIELD | Admitting: Hematology and Oncology

## 2017-10-25 ENCOUNTER — Other Ambulatory Visit: Payer: Self-pay | Admitting: Family Medicine

## 2017-10-25 ENCOUNTER — Ambulatory Visit
Admission: RE | Admit: 2017-10-25 | Discharge: 2017-10-25 | Disposition: A | Discharge status: Home / Self Care | Payer: BLUE CROSS/BLUE SHIELD | Source: Ambulatory Visit | Attending: Family Medicine | Admitting: Family Medicine

## 2017-10-25 DIAGNOSIS — R05 Cough: Secondary | ICD-10-CM

## 2017-10-25 DIAGNOSIS — R059 Cough, unspecified: Secondary | ICD-10-CM

## 2017-11-01 ENCOUNTER — Other Ambulatory Visit: Payer: Self-pay | Admitting: Family Medicine

## 2017-11-01 DIAGNOSIS — R911 Solitary pulmonary nodule: Secondary | ICD-10-CM

## 2017-11-08 ENCOUNTER — Ambulatory Visit
Admission: RE | Admit: 2017-11-08 | Discharge: 2017-11-08 | Disposition: A | Discharge status: Home / Self Care | Payer: BLUE CROSS/BLUE SHIELD | Source: Ambulatory Visit | Attending: Family Medicine | Admitting: Family Medicine

## 2017-11-08 DIAGNOSIS — R911 Solitary pulmonary nodule: Secondary | ICD-10-CM

## 2018-01-22 ENCOUNTER — Ambulatory Visit (INDEPENDENT_AMBULATORY_CARE_PROVIDER_SITE_OTHER): Payer: Self-pay

## 2018-01-22 ENCOUNTER — Ambulatory Visit (INDEPENDENT_AMBULATORY_CARE_PROVIDER_SITE_OTHER): Payer: BLUE CROSS/BLUE SHIELD | Admitting: Orthopaedic Surgery

## 2018-01-22 ENCOUNTER — Encounter (INDEPENDENT_AMBULATORY_CARE_PROVIDER_SITE_OTHER): Payer: Self-pay | Admitting: Orthopaedic Surgery

## 2018-01-22 DIAGNOSIS — G8929 Other chronic pain: Secondary | ICD-10-CM

## 2018-01-22 DIAGNOSIS — M25561 Pain in right knee: Secondary | ICD-10-CM

## 2018-01-22 NOTE — Progress Notes (Signed)
PCP: Farris Has, MD  Subjective:   HPI: Patient is a 47 y.o. male here for right knee pain x 1.5 months. Pt describes anterior knee pain that waxes and wanes. Denies any known MOI. Worse with ascending and descending stairs. Improved with heat/ice. He denies any mechanical locking. He does report giving way on stairs and has fallen going down the stairs. He reports some feeling of swelling and can hear popping in the knee. Denies any erythema, fever or chills. He has h/o radiculopahty and peripheral neuropathy but denies any new or worsening symptoms.   Past Medical History:  Diagnosis Date  . Asthma   . Hyperlipidemia   . Hypertension   . Migraines     Current Outpatient Medications on File Prior to Visit  Medication Sig Dispense Refill  . albuterol (PROVENTIL HFA;VENTOLIN HFA) 108 (90 BASE) MCG/ACT inhaler Inhale 1 puff into the lungs every 6 (six) hours as needed for wheezing.    Marland Kitchen atorvastatin (LIPITOR) 20 MG tablet Take 20 mg by mouth daily.    Marland Kitchen gabapentin (NEURONTIN) 600 MG tablet Take 600 mg by mouth 3 (three) times daily.     . OXAYDO 5 MG TABA Take 5 mg by mouth 3 (three) times daily as needed for pain.  0  . pantoprazole (PROTONIX) 40 MG tablet Take 1 tablet (40 mg total) by mouth 2 (two) times daily. 60 tablet 2  . sertraline (ZOLOFT) 50 MG tablet Take 50 mg by mouth daily.  1  . XTAMPZA ER 9 MG C12A Take 9 mg by mouth 2 (two) times daily.  0   No current facility-administered medications on file prior to visit.     Past Surgical History:  Procedure Laterality Date  . BACK SURGERY    . ESOPHAGOGASTRODUODENOSCOPY N/A 03/02/2017   Procedure: ESOPHAGOGASTRODUODENOSCOPY (EGD);  Surgeon: Vida Rigger, MD;  Location[facility] ENDOSCOPY;  Service: Endoscopy;  Laterality: N/A;  . TONSILLECTOMY      No Known Allergies  Social History   Socioeconomic History  . Marital status: Married    Spouse name: Not on file  . Number of children: Not on file  . Years of education: Not  on file  . Highest education level: Not on file  Occupational History  . Not on file  Social Needs  . Financial resource strain: Not on file  . Food insecurity:    Worry: Not on file    Inability: Not on file  . Transportation needs:    Medical: Not on file    Non-medical: Not on file  Tobacco Use  . Smoking status: Current Every Day Smoker    Packs/day: 1.00    Types: Cigarettes  . Smokeless tobacco: Never Used  Substance and Sexual Activity  . Alcohol use: No  . Drug use: No  . Sexual activity: Not on file  Lifestyle  . Physical activity:    Days per week: Not on file    Minutes per session: Not on file  . Stress: Not on file  Relationships  . Social connections:    Talks on phone: Not on file    Gets together: Not on file    Attends religious service: Not on file    Active member of club or organization: Not on file    Attends meetings of clubs or organizations: Not on file    Relationship status: Not on file  . Intimate partner violence:    Fear of current or ex partner: Not on file  Emotionally abused: Not on file    Physically abused: Not on file    Forced sexual activity: Not on file  Other Topics Concern  . Not on file  Social History Narrative  . Not on file    History reviewed. No pertinent family history.  There were no vitals taken for this visit.  Review of Systems: See HPI above.     Objective:  Physical Exam:  Gen: NAD, comfortable in exam room  Right knee: No erythema or bruising. Trace effusion noted. TTP over the patella and inferior pole of the patella. PF Crepitus with manipulation. 5/5 strength. No ligamentous instability. Negative mcmurrays, neg ant/post drawer.   Assessment & Plan:  1. Right knee pain likely 2/2 patellofemoral syndrome and chondromalacia. No concern for ligamentous or meniscal injury. Xrays obtained and reviewed; no findings of bony abnormality or degenerative changes. - NSAIDs - Pt fit and provided with knee  sleeve today - RICE - Physical Therapy referral placed today.

## 2018-03-05 ENCOUNTER — Telehealth (INDEPENDENT_AMBULATORY_CARE_PROVIDER_SITE_OTHER): Payer: Self-pay | Admitting: Orthopaedic Surgery

## 2018-03-05 NOTE — Telephone Encounter (Signed)
Received vm from [place] [facility] inquiring if patient has been seen here,I called her back and advised patient was seen in July and stated she would send over request

## 2018-04-19 ENCOUNTER — Encounter: Payer: Self-pay | Admitting: Family Medicine

## 2018-05-06 ENCOUNTER — Ambulatory Visit: Payer: BLUE CROSS/BLUE SHIELD | Attending: Pain Medicine | Admitting: Physical Therapy

## 2018-05-06 ENCOUNTER — Encounter: Payer: Self-pay | Admitting: Physical Therapy

## 2018-05-06 ENCOUNTER — Other Ambulatory Visit: Payer: Self-pay

## 2018-05-06 DIAGNOSIS — M79605 Pain in left leg: Secondary | ICD-10-CM | POA: Insufficient documentation

## 2018-05-06 DIAGNOSIS — G8929 Other chronic pain: Secondary | ICD-10-CM | POA: Insufficient documentation

## 2018-05-06 DIAGNOSIS — M6281 Muscle weakness (generalized): Secondary | ICD-10-CM | POA: Diagnosis present

## 2018-05-06 DIAGNOSIS — M545 Low back pain, unspecified: Secondary | ICD-10-CM

## 2018-05-06 NOTE — Patient Instructions (Signed)
Access Code: [email or web address]  URL: [email or web address]  Date: 05/06/2018  Prepared by: Dorie Rank   Exercises  Seated Hamstring Stretch - 3 reps - 1 sets - 30 sec hold - 1x daily - 7x weekly
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
## 2018-05-08 ENCOUNTER — Ambulatory Visit: Payer: BLUE CROSS/BLUE SHIELD | Admitting: Physical Therapy

## 2018-05-08 DIAGNOSIS — M6281 Muscle weakness (generalized): Secondary | ICD-10-CM

## 2018-05-08 DIAGNOSIS — G8929 Other chronic pain: Secondary | ICD-10-CM

## 2018-05-08 DIAGNOSIS — M545 Low back pain: Principal | ICD-10-CM

## 2018-05-08 DIAGNOSIS — M79605 Pain in left leg: Secondary | ICD-10-CM

## 2018-05-08 NOTE — Therapy (Signed)
[facility] [address] [place], [place], [address] Phone: [phone]   Fax:  [phone]  Physical Therapy Treatment  Patient Details  Name: Jack Greene MRN: [id number] Date of Birth: 11-10-1970 Referring Provider (PT): Ardell Isaacs, MD   Encounter Date: 05/08/2018  PT End of Session - 05/08/18 1220    Visit Number  2    Date for PT Re-Evaluation  07/29/18    PT Start Time  1146    PT Stop Time  1226    PT Time Calculation (min)  40 min    Activity Tolerance  Patient limited by pain    Behavior During Therapy  Anxious       Past Medical History:  Diagnosis Date  . Asthma   . Hyperlipidemia   . Hypertension   . Migraines     Past Surgical History:  Procedure Laterality Date  . BACK SURGERY    . ESOPHAGOGASTRODUODENOSCOPY N/A 03/02/2017   Procedure: ESOPHAGOGASTRODUODENOSCOPY (EGD);  Surgeon: Vida Rigger, MD;  Location: [facility] ENDOSCOPY;  Service: Endoscopy;  Laterality: N/A;  . TONSILLECTOMY      There were no vitals filed for this visit.  Subjective Assessment - 05/08/18 1154    Subjective  I am having an average amount of pain.  PT not asking about a number due to type of chronic pain and hypersensativity.    Patient Stated Goals  I want to take less pain medicine, learn what to do to get back into                        [facility] Adult PT Treatment/Exercise - 05/08/18 0001      Neuro Re-ed    Neuro Re-ed Details   educated and performed diapragmatic breathing      Exercises   Exercises  Knee/Hip      Knee/Hip Exercises: Stretches   Active Hamstring Stretch  Right;Left;2 reps;30 seconds   using leg pillow to rest on; strap   Other Knee/Hip Stretches  internal hip rotation      Manual Therapy   Manual Therapy  Manual Traction;Myofascial release    Myofascial Release  respiratory and occipital release to calm nerve with cues for breathing techniqeus    Manual Traction   lumbar traction bilat LE pull with about 25 deg flexion of hips               PT Short Term Goals - 05/06/18 1823      PT SHORT TERM GOAL #1   Title  pt will be able to walk every day for at least 10 min    Time  3    Period  Weeks    Status  New    Target Date  05/27/18      PT SHORT TERM GOAL #2   Title  Pt will be able to perform HEP daily for improved strength    Time  3    Period  Weeks    Status  New    Target Date  05/27/18        PT Long Term Goals - 05/06/18 1831      PT LONG TERM GOAL #1   Title  ind with advanced HEP    Time  12    Period  Weeks    Status  New    Target Date  07/29/18      PT LONG TERM GOAL #2  Title  Pt reports able to increase activities to triple amount prior to PT    Time  12    Period  Weeks    Status  New    Target Date  07/29/18      PT LONG TERM GOAL #3   Title  Pt will be able to walk for 40 minutes without stopping due to fatigue and numbness    Time  12    Period  Weeks    Status  New    Target Date  07/29/18      PT LONG TERM GOAL #4   Title  Pt will demonstrate hip strength of at least 4+/5 due to improved strength and ability to manage pain    Time  12    Period  Weeks    Status  New    Target Date  07/29/18      PT LONG TERM GOAL #5   Title  pt will report he is able to reduce pain medicine intake by 30% due to improved ability to manage pain    Time  12    Period  Weeks    Status  New    Target Date  07/29/18            Plan - 05/08/18 1357    Clinical Impression Statement  Pt had increased pain with all movements.  He expresses a lot of relief with manual traction.  Pt is very determind to perform exercises.  He was able to add new stretches to HEP.  Pt will benefit from skilled PT to work on ROM.    PT Treatment/Interventions  ADLs/Self Care Home Management;Biofeedback;Moist Heat;Cryotherapy;Gait training;Stair training;Functional mobility training;Therapeutic activities;Therapeutic  exercise;Balance training;Neuromuscular re-education;Patient/family education;Manual techniques;Scar mobilization;Passive range of motion;Dry needling;Taping    PT Next Visit Plan  gentle ROM, manual traction to BLE, pain education when applicable, myofascial release BLE and lumbar regions    Consulted and Agree with Plan of Care  Patient       Patient will benefit from skilled therapeutic intervention in order to improve the following deficits and impairments:  Abnormal gait, Increased fascial restricitons, Pain, Impaired sensation, Postural dysfunction, Increased muscle spasms, Decreased strength, Decreased range of motion  Visit Diagnosis: No diagnosis found.     Problem List Patient Active Problem List   Diagnosis Date Noted  . UGIB (upper gastrointestinal bleed) 03/01/2017  . Symptomatic anemia 03/01/2017    Vincente Poli, PT 05/08/2018, 1:59 PM  [facility] [address] [place], [place], [address] Phone: [phone]   Fax:  [phone]  Name: Jack Greene MRN: [id number] Date of Birth: [birth year]

## 2018-05-14 ENCOUNTER — Encounter: Payer: BLUE CROSS/BLUE SHIELD | Attending: Family Medicine | Admitting: *Deleted

## 2018-05-14 DIAGNOSIS — Z713 Dietary counseling and surveillance: Secondary | ICD-10-CM | POA: Insufficient documentation

## 2018-05-14 DIAGNOSIS — E119 Type 2 diabetes mellitus without complications: Secondary | ICD-10-CM | POA: Diagnosis not present

## 2018-05-14 DIAGNOSIS — Z6834 Body mass index (BMI) 34.0-34.9, adult: Secondary | ICD-10-CM | POA: Insufficient documentation

## 2018-05-15 ENCOUNTER — Ambulatory Visit: Payer: BLUE CROSS/BLUE SHIELD | Attending: Pain Medicine | Admitting: Physical Therapy

## 2018-05-15 DIAGNOSIS — M6281 Muscle weakness (generalized): Secondary | ICD-10-CM | POA: Diagnosis present

## 2018-05-15 DIAGNOSIS — G8929 Other chronic pain: Secondary | ICD-10-CM

## 2018-05-15 DIAGNOSIS — M545 Low back pain: Secondary | ICD-10-CM | POA: Insufficient documentation

## 2018-05-15 DIAGNOSIS — M79605 Pain in left leg: Secondary | ICD-10-CM

## 2018-05-15 NOTE — Patient Instructions (Signed)
Plan:  Aim for 4 Carb Choices per meal (60 grams) +/- 1 either way  Aim for 0-2 Carbs per snack if hungry  Include protein in moderation with your meals and snacks Consider reading food labels for Total Carbohydrate of foods Consider  increasing your activity level by either arm chair or water exercises for 10-15 minutes daily as tolerated Consider obtaining a meter and checking BG at alternate times per day including after any exercise
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
## 2018-05-15 NOTE — Patient Instructions (Signed)
Access Code: [email or web address]  URL: [email or web address]  Date: 05/15/2018  Prepared by: Dorie Rank   Exercises  Seated Hamstring Stretch - 3 reps - 1 sets - 30 sec hold - 1x daily - 7x weekly  Supine Bilateral Hip Internal Rotation Stretch - 3 reps - 1 sets - 30 sec hold - 1x daily - 7x weekly  Bent Knee Fallouts - 10 reps - 3 sets - 1x daily - 7x weekly  Hooklying Isometric Clamshell - 10 reps - 3 sets - 1x daily - 7x weekly

## 2018-05-15 NOTE — Therapy (Addendum)
[facility] [address] [place], [place], [address] Phone: [phone]   Fax:  [phone]  Physical Therapy Treatment  Patient Details  Name: LATOYA DISKIN MRN: [id number] Date of Birth: 11/17/1970 Referring Provider (PT): Dian Situ, MD   Encounter Date: 05/15/2018  PT End of Session - 05/15/18 1313    Visit Number  3    Date for PT Re-Evaluation  07/29/18    Authorization Type  [employer]    Authorization Time Period  re-assess after 6 visits for reduced visits/week    PT Start Time  6812    PT Stop Time  1315    PT Time Calculation (min)  40 min    Activity Tolerance  Patient limited by pain;Patient tolerated treatment well;Patient limited by fatigue    Behavior During Therapy  [facility] for tasks assessed/performed       Past Medical History:  Diagnosis Date  . Asthma   . Hyperlipidemia   . Hypertension   . Migraines     Past Surgical History:  Procedure Laterality Date  . BACK SURGERY    . ESOPHAGOGASTRODUODENOSCOPY N/A 03/02/2017   Procedure: ESOPHAGOGASTRODUODENOSCOPY (EGD);  Surgeon: Clarene Essex, MD;  Location: [place];  Service: Endoscopy;  Laterality: N/A;  . TONSILLECTOMY      There were no vitals filed for this visit.  Subjective Assessment - 05/15/18 1329    Subjective  I am not feeling very well, I think it's allergies.    Limitations  Sitting;Walking    Patient Stated Goals  I want to take less pain medicine, learn what to do to get back into     Currently in Pain?  Yes   average                      [facility] Adult PT Treatment/Exercise - 05/15/18 0001      Exercises   Exercises  Knee/Hip;Lumbar      Lumbar Exercises: Stretches   Single Knee to Chest Stretch  Right;Left;5 reps;10 seconds      Lumbar Exercises: Aerobic   Nustep  L5 x 5 min      Lumbar Exercises: Seated   Long Arc Quad on Chair  Strengthening;Right;Left;5 reps      Lumbar Exercises: Supine   Clam  15 reps   red band   Bridge  10 reps    Large Ball Abdominal Isometric  10 reps      Knee/Hip Exercises: Stretches   Active Hamstring Stretch  Right;Left;5 reps;10 seconds   using leg pillow for easier lifting of LE     Knee/Hip Exercises: Seated   Long Arc Quad  Strengthening;Both;5 reps             PT Education - 05/15/18 1321    Education Details   Access Code: [email or web address]     Person(s) Educated  Patient    Methods  Explanation;Demonstration;Handout;Verbal cues;Tactile cues    Comprehension  Verbalized understanding;Returned demonstration       PT Short Term Goals - 05/15/18 1325      PT SHORT TERM GOAL #1   Title  pt will be able to walk every day for at least 10 min    Status  On-going      PT SHORT TERM GOAL #2   Title  Pt will be able to perform HEP daily for improved strength    Status  On-going        PT Long  Term Goals - 05/06/18 1831      PT LONG TERM GOAL #1   Title  ind with advanced HEP    Time  12    Period  Weeks    Status  New    Target Date  07/29/18      PT LONG TERM GOAL #2   Title  Pt reports able to increase activities to triple amount prior to PT    Time  12    Period  Weeks    Status  New    Target Date  07/29/18      PT LONG TERM GOAL #3   Title  Pt will be able to walk for 40 minutes without stopping due to fatigue and numbness    Time  12    Period  Weeks    Status  New    Target Date  07/29/18      PT LONG TERM GOAL #4   Title  Pt will demonstrate hip strength of at least 4+/5 due to improved strength and ability to manage pain    Time  12    Period  Weeks    Status  New    Target Date  07/29/18      PT LONG TERM GOAL #5   Title  pt will report he is able to reduce pain medicine intake by 30% due to improved ability to manage pain    Time  12    Period  Weeks    Status  New    Target Date  07/29/18            Plan - 05/15/18 1313    Clinical Impression Statement  Pt did well today and was able to  tolerate more exercises.  Pt was noticeably fatigued at the end of session.  He will benefit from skilled PT to porgress with increased ROM and strengthening.    PT Treatment/Interventions  ADLs/Self Care Home Management;Biofeedback;Moist Heat;Cryotherapy;Gait training;Stair training;Functional mobility training;Therapeutic activities;Therapeutic exercise;Balance training;Neuromuscular re-education;Patient/family education;Manual techniques;Scar mobilization;Passive range of motion;Dry needling;Taping    PT Next Visit Plan  f/u on walking daily, LE, core and posture strength as tolerated    PT Home Exercise Plan   Access Code: [email or web address]     Consulted and Agree with Plan of Care  Patient       Patient will benefit from skilled therapeutic intervention in order to improve the following deficits and impairments:  Abnormal gait, Increased fascial restricitons, Pain, Impaired sensation, Postural dysfunction, Increased muscle spasms, Decreased strength, Decreased range of motion  Visit Diagnosis: Chronic low back pain, unspecified back pain laterality, unspecified whether sciatica present  Pain in left leg  Muscle weakness (generalized)     Problem List Patient Active Problem List   Diagnosis Date Noted  . UGIB (upper gastrointestinal bleed) 03/01/2017  . Symptomatic anemia 03/01/2017    Zannie Cove, PT 05/15/2018, 1:30 PM  [facility] [address] [place], [place], [address] Phone: [phone]   Fax:  [phone]  Name: BRYCEN BEAN MRN: [id number] Date of Birth: 25-Aug-1970  PHYSICAL THERAPY DISCHARGE SUMMARY  Visits from Start of Care: 3  Current functional level related to goals / functional outcomes: See above   Remaining deficits: See above   Education / Equipment: HEP  Plan: Patient agrees to discharge.  Patient goals were not met. Patient is being discharged due to not returning since the last  visit.  ?????   Pt  had 2 or more missed appointments and no remaining scheduled appointments.    Zannie Cove, PT 06/11/18 9:25 AM

## 2018-05-20 ENCOUNTER — Ambulatory Visit: Payer: BLUE CROSS/BLUE SHIELD | Admitting: Physical Therapy

## 2018-05-22 ENCOUNTER — Telehealth: Payer: Self-pay | Admitting: Physical Therapy

## 2018-05-22 ENCOUNTER — Ambulatory Visit: Payer: BLUE CROSS/BLUE SHIELD | Admitting: Physical Therapy

## 2018-05-22 NOTE — Telephone Encounter (Signed)
PTA called pt for second missed appt. PTA could not leave message as voice mailbox was full. Ok to DC for 3rd no show.  Ane PaymentJennifer Milton Sagona, PTA @TODAY @ 12:19 PM

## 2018-05-24 ENCOUNTER — Ambulatory Visit: Payer: BLUE CROSS/BLUE SHIELD | Admitting: Physical Therapy

## 2018-05-27 ENCOUNTER — Encounter: Payer: BLUE CROSS/BLUE SHIELD | Admitting: Physical Therapy

## 2018-05-29 ENCOUNTER — Encounter: Payer: BLUE CROSS/BLUE SHIELD | Admitting: Physical Therapy

## 2018-05-31 ENCOUNTER — Encounter: Payer: BLUE CROSS/BLUE SHIELD | Admitting: Physical Therapy

## 2018-06-03 ENCOUNTER — Encounter: Payer: BLUE CROSS/BLUE SHIELD | Admitting: Physical Therapy

## 2018-06-10 ENCOUNTER — Encounter: Payer: BLUE CROSS/BLUE SHIELD | Admitting: Physical Therapy

## 2018-06-12 ENCOUNTER — Encounter: Payer: BLUE CROSS/BLUE SHIELD | Admitting: Physical Therapy

## 2018-06-14 ENCOUNTER — Encounter: Payer: BLUE CROSS/BLUE SHIELD | Admitting: Physical Therapy

## 2018-06-17 ENCOUNTER — Encounter: Payer: BLUE CROSS/BLUE SHIELD | Admitting: Physical Therapy

## 2018-06-19 ENCOUNTER — Encounter: Payer: BLUE CROSS/BLUE SHIELD | Admitting: Physical Therapy

## 2018-06-21 ENCOUNTER — Encounter: Payer: BLUE CROSS/BLUE SHIELD | Admitting: Physical Therapy

## 2019-10-06 ENCOUNTER — Ambulatory Visit: Payer: Medicare Other | Attending: Internal Medicine

## 2019-10-06 DIAGNOSIS — Z23 Encounter for immunization: Secondary | ICD-10-CM

## 2019-10-06 NOTE — Progress Notes (Signed)
   Covid-19 Vaccination Clinic  Name:  Jack Greene    MRN: [id number] DOB: April 05, 1971  10/06/2019  [name] was observed post Covid-19 immunization for 15 minutes without incident. He was provided with Vaccine Information Sheet and instruction to access the V-Safe system.   [name] was instructed to call 911 with any severe reactions post vaccine: Marland Kitchen Difficulty breathing  . Swelling of face and throat  . A fast heartbeat  . A bad rash all over body  . Dizziness and weakness   Immunizations Administered    Name Date Dose VIS Date Route   Pfizer COVID-19 Vaccine 10/06/2019  4:30 PM 0.3 mL 06/20/2019 Intramuscular   Manufacturer: [employer], [employer]   Lot: [id number]   NDC: [address]-4

## 2019-10-29 ENCOUNTER — Ambulatory Visit: Payer: Medicare Other | Attending: Internal Medicine

## 2019-10-29 DIAGNOSIS — Z23 Encounter for immunization: Secondary | ICD-10-CM

## 2019-10-29 NOTE — Progress Notes (Signed)
   Covid-19 Vaccination Clinic  Name:  Beckam D Guevara    MRN: [id number] DOB: 10/05/1970  10/29/2019  Mr. Baudoin was observed post Covid-19 immunization for 15 minutes without incident. He was provided with Vaccine Information Sheet and instruction to access the V-Safe system.   Mr. Suit was instructed to call 911 with any severe reactions post vaccine: . Difficulty breathing  . Swelling of face and throat  . A fast heartbeat  . A bad rash all over body  . Dizziness and weakness   Immunizations Administered    Name Date Dose VIS Date Route   Pfizer COVID-19 Vaccine 10/29/2019  2:55 PM 0.3 mL 09/03/2018 Intramuscular   Manufacturer: Pfizer, Inc   Lot: [id number]   NDC: [address]-2     

## 2019-10-29 NOTE — Progress Notes (Signed)
   Covid-19 Vaccination Clinic  Name:  Jack Greene    MRN: [id number] DOB: [birth year]  10/29/2019  [name] was observed post Covid-19 immunization for 15 minutes without incident. He was provided with Vaccine Information Sheet and instruction to access the V-Safe system.   [name] was instructed to call 911 with any severe reactions post vaccine: Marland Kitchen Difficulty breathing  . Swelling of face and throat  . A fast heartbeat  . A bad rash all over body  . Dizziness and weakness   Immunizations Administered    Name Date Dose VIS Date Route   Pfizer COVID-19 Vaccine 10/29/2019  2:55 PM 0.3 mL 09/03/2018 Intramuscular   Manufacturer: [employer], [employer]   Lot: [id number]   NDC: [address]-8

## 2020-01-19 ENCOUNTER — Other Ambulatory Visit: Payer: Self-pay | Admitting: Nurse Practitioner

## 2020-01-19 DIAGNOSIS — M5136 Other intervertebral disc degeneration, lumbar region: Secondary | ICD-10-CM

## 2020-02-15 ENCOUNTER — Other Ambulatory Visit: Payer: Self-pay

## 2020-02-15 ENCOUNTER — Ambulatory Visit
Admission: RE | Admit: 2020-02-15 | Discharge: 2020-02-15 | Disposition: A | Discharge status: Home / Self Care | Payer: Medicare Other | Source: Ambulatory Visit | Attending: Nurse Practitioner | Admitting: Nurse Practitioner

## 2020-02-15 DIAGNOSIS — M5136 Other intervertebral disc degeneration, lumbar region: Secondary | ICD-10-CM

## 2020-02-15 MED ORDER — GADOBENATE DIMEGLUMINE 529 MG/ML IV SOLN
20.0000 mL | Freq: Once | INTRAVENOUS | Status: AC | PRN
  Administered 2020-02-15: 20 mL via INTRAVENOUS

## 2020-07-19 DIAGNOSIS — G4700 Insomnia, unspecified: Secondary | ICD-10-CM | POA: Diagnosis not present

## 2020-07-19 DIAGNOSIS — G4733 Obstructive sleep apnea (adult) (pediatric): Secondary | ICD-10-CM | POA: Diagnosis not present

## 2020-07-19 DIAGNOSIS — G894 Chronic pain syndrome: Secondary | ICD-10-CM | POA: Diagnosis not present

## 2020-07-19 DIAGNOSIS — Z79899 Other long term (current) drug therapy: Secondary | ICD-10-CM | POA: Diagnosis not present

## 2020-07-19 DIAGNOSIS — M5416 Radiculopathy, lumbar region: Secondary | ICD-10-CM | POA: Diagnosis not present

## 2020-07-19 DIAGNOSIS — G8929 Other chronic pain: Secondary | ICD-10-CM | POA: Diagnosis not present

## 2020-08-19 DIAGNOSIS — G8929 Other chronic pain: Secondary | ICD-10-CM | POA: Diagnosis not present

## 2020-08-19 DIAGNOSIS — G894 Chronic pain syndrome: Secondary | ICD-10-CM | POA: Diagnosis not present

## 2020-08-19 DIAGNOSIS — G4700 Insomnia, unspecified: Secondary | ICD-10-CM | POA: Diagnosis not present

## 2020-08-19 DIAGNOSIS — Z79899 Other long term (current) drug therapy: Secondary | ICD-10-CM | POA: Diagnosis not present

## 2020-08-19 DIAGNOSIS — M5416 Radiculopathy, lumbar region: Secondary | ICD-10-CM | POA: Diagnosis not present

## 2020-09-16 DIAGNOSIS — Z79899 Other long term (current) drug therapy: Secondary | ICD-10-CM | POA: Diagnosis not present

## 2020-09-16 DIAGNOSIS — G8929 Other chronic pain: Secondary | ICD-10-CM | POA: Diagnosis not present

## 2020-09-16 DIAGNOSIS — F17200 Nicotine dependence, unspecified, uncomplicated: Secondary | ICD-10-CM | POA: Diagnosis not present

## 2020-09-16 DIAGNOSIS — G894 Chronic pain syndrome: Secondary | ICD-10-CM | POA: Diagnosis not present

## 2020-09-16 DIAGNOSIS — G4700 Insomnia, unspecified: Secondary | ICD-10-CM | POA: Diagnosis not present

## 2020-09-16 DIAGNOSIS — M5416 Radiculopathy, lumbar region: Secondary | ICD-10-CM | POA: Diagnosis not present

## 2020-09-16 DIAGNOSIS — F17210 Nicotine dependence, cigarettes, uncomplicated: Secondary | ICD-10-CM | POA: Diagnosis not present

## 2020-10-18 DIAGNOSIS — M5416 Radiculopathy, lumbar region: Secondary | ICD-10-CM | POA: Diagnosis not present

## 2020-10-18 DIAGNOSIS — F17200 Nicotine dependence, unspecified, uncomplicated: Secondary | ICD-10-CM | POA: Diagnosis not present

## 2020-10-18 DIAGNOSIS — F17210 Nicotine dependence, cigarettes, uncomplicated: Secondary | ICD-10-CM | POA: Diagnosis not present

## 2020-10-18 DIAGNOSIS — G894 Chronic pain syndrome: Secondary | ICD-10-CM | POA: Diagnosis not present

## 2020-10-18 DIAGNOSIS — G4700 Insomnia, unspecified: Secondary | ICD-10-CM | POA: Diagnosis not present

## 2020-10-18 DIAGNOSIS — G8929 Other chronic pain: Secondary | ICD-10-CM | POA: Diagnosis not present

## 2020-10-18 DIAGNOSIS — Z79899 Other long term (current) drug therapy: Secondary | ICD-10-CM | POA: Diagnosis not present

## 2020-11-03 DIAGNOSIS — J45909 Unspecified asthma, uncomplicated: Secondary | ICD-10-CM | POA: Diagnosis not present

## 2020-11-03 DIAGNOSIS — G8929 Other chronic pain: Secondary | ICD-10-CM | POA: Diagnosis not present

## 2020-11-03 DIAGNOSIS — I10 Essential (primary) hypertension: Secondary | ICD-10-CM | POA: Diagnosis not present

## 2020-11-03 DIAGNOSIS — Z720 Tobacco use: Secondary | ICD-10-CM | POA: Diagnosis not present

## 2020-11-03 DIAGNOSIS — E1169 Type 2 diabetes mellitus with other specified complication: Secondary | ICD-10-CM | POA: Diagnosis not present

## 2020-11-03 DIAGNOSIS — E559 Vitamin D deficiency, unspecified: Secondary | ICD-10-CM | POA: Diagnosis not present

## 2020-11-03 DIAGNOSIS — D649 Anemia, unspecified: Secondary | ICD-10-CM | POA: Diagnosis not present

## 2020-11-16 DIAGNOSIS — M48062 Spinal stenosis, lumbar region with neurogenic claudication: Secondary | ICD-10-CM | POA: Diagnosis not present

## 2020-11-17 DIAGNOSIS — Z79899 Other long term (current) drug therapy: Secondary | ICD-10-CM | POA: Diagnosis not present

## 2020-11-17 DIAGNOSIS — M5416 Radiculopathy, lumbar region: Secondary | ICD-10-CM | POA: Diagnosis not present

## 2020-11-17 DIAGNOSIS — G8929 Other chronic pain: Secondary | ICD-10-CM | POA: Diagnosis not present

## 2020-11-17 DIAGNOSIS — G894 Chronic pain syndrome: Secondary | ICD-10-CM | POA: Diagnosis not present

## 2020-11-17 DIAGNOSIS — R7303 Prediabetes: Secondary | ICD-10-CM | POA: Diagnosis not present

## 2020-11-17 DIAGNOSIS — F17200 Nicotine dependence, unspecified, uncomplicated: Secondary | ICD-10-CM | POA: Diagnosis not present

## 2020-11-17 DIAGNOSIS — F17210 Nicotine dependence, cigarettes, uncomplicated: Secondary | ICD-10-CM | POA: Diagnosis not present

## 2020-11-17 DIAGNOSIS — R030 Elevated blood-pressure reading, without diagnosis of hypertension: Secondary | ICD-10-CM | POA: Diagnosis not present

## 2020-11-23 DIAGNOSIS — G4733 Obstructive sleep apnea (adult) (pediatric): Secondary | ICD-10-CM | POA: Diagnosis not present

## 2020-11-24 ENCOUNTER — Emergency Department (HOSPITAL_COMMUNITY)
Admission: EM | Admit: 2020-11-24 | Discharge: 2020-11-24 | Discharge status: Against Medical Advice | Payer: Medicare Other

## 2020-11-26 DIAGNOSIS — I10 Essential (primary) hypertension: Secondary | ICD-10-CM | POA: Diagnosis not present

## 2020-12-20 DIAGNOSIS — R030 Elevated blood-pressure reading, without diagnosis of hypertension: Secondary | ICD-10-CM | POA: Diagnosis not present

## 2020-12-20 DIAGNOSIS — G4700 Insomnia, unspecified: Secondary | ICD-10-CM | POA: Diagnosis not present

## 2020-12-20 DIAGNOSIS — Z79899 Other long term (current) drug therapy: Secondary | ICD-10-CM | POA: Diagnosis not present

## 2020-12-20 DIAGNOSIS — R7303 Prediabetes: Secondary | ICD-10-CM | POA: Diagnosis not present

## 2020-12-20 DIAGNOSIS — M5416 Radiculopathy, lumbar region: Secondary | ICD-10-CM | POA: Diagnosis not present

## 2020-12-20 DIAGNOSIS — F17210 Nicotine dependence, cigarettes, uncomplicated: Secondary | ICD-10-CM | POA: Diagnosis not present

## 2020-12-20 DIAGNOSIS — F17200 Nicotine dependence, unspecified, uncomplicated: Secondary | ICD-10-CM | POA: Diagnosis not present

## 2020-12-20 DIAGNOSIS — G8929 Other chronic pain: Secondary | ICD-10-CM | POA: Diagnosis not present

## 2020-12-27 DIAGNOSIS — Z79899 Other long term (current) drug therapy: Secondary | ICD-10-CM | POA: Diagnosis not present

## 2020-12-28 DIAGNOSIS — R0602 Shortness of breath: Secondary | ICD-10-CM | POA: Diagnosis not present

## 2020-12-28 DIAGNOSIS — R5383 Other fatigue: Secondary | ICD-10-CM | POA: Diagnosis not present

## 2020-12-30 DIAGNOSIS — G4733 Obstructive sleep apnea (adult) (pediatric): Secondary | ICD-10-CM | POA: Diagnosis not present

## 2021-01-07 DIAGNOSIS — G4733 Obstructive sleep apnea (adult) (pediatric): Secondary | ICD-10-CM | POA: Diagnosis not present

## 2021-01-19 DIAGNOSIS — F17200 Nicotine dependence, unspecified, uncomplicated: Secondary | ICD-10-CM | POA: Diagnosis not present

## 2021-01-19 DIAGNOSIS — G4700 Insomnia, unspecified: Secondary | ICD-10-CM | POA: Diagnosis not present

## 2021-01-19 DIAGNOSIS — R030 Elevated blood-pressure reading, without diagnosis of hypertension: Secondary | ICD-10-CM | POA: Diagnosis not present

## 2021-01-19 DIAGNOSIS — M5416 Radiculopathy, lumbar region: Secondary | ICD-10-CM | POA: Diagnosis not present

## 2021-01-19 DIAGNOSIS — G8929 Other chronic pain: Secondary | ICD-10-CM | POA: Diagnosis not present

## 2021-01-19 DIAGNOSIS — Z79899 Other long term (current) drug therapy: Secondary | ICD-10-CM | POA: Diagnosis not present

## 2021-01-19 DIAGNOSIS — R7303 Prediabetes: Secondary | ICD-10-CM | POA: Diagnosis not present

## 2021-01-19 DIAGNOSIS — F17210 Nicotine dependence, cigarettes, uncomplicated: Secondary | ICD-10-CM | POA: Diagnosis not present

## 2021-01-24 DIAGNOSIS — Z79899 Other long term (current) drug therapy: Secondary | ICD-10-CM | POA: Diagnosis not present

## 2021-01-25 DIAGNOSIS — G4733 Obstructive sleep apnea (adult) (pediatric): Secondary | ICD-10-CM | POA: Diagnosis not present

## 2021-02-17 DIAGNOSIS — Z79899 Other long term (current) drug therapy: Secondary | ICD-10-CM | POA: Diagnosis not present

## 2021-02-17 DIAGNOSIS — R030 Elevated blood-pressure reading, without diagnosis of hypertension: Secondary | ICD-10-CM | POA: Diagnosis not present

## 2021-02-17 DIAGNOSIS — G894 Chronic pain syndrome: Secondary | ICD-10-CM | POA: Diagnosis not present

## 2021-02-17 DIAGNOSIS — R7303 Prediabetes: Secondary | ICD-10-CM | POA: Diagnosis not present

## 2021-02-17 DIAGNOSIS — M5136 Other intervertebral disc degeneration, lumbar region: Secondary | ICD-10-CM | POA: Diagnosis not present

## 2021-02-21 DIAGNOSIS — Z79899 Other long term (current) drug therapy: Secondary | ICD-10-CM | POA: Diagnosis not present

## 2021-02-23 DIAGNOSIS — G4733 Obstructive sleep apnea (adult) (pediatric): Secondary | ICD-10-CM | POA: Diagnosis not present

## 2021-02-24 DIAGNOSIS — G4733 Obstructive sleep apnea (adult) (pediatric): Secondary | ICD-10-CM | POA: Diagnosis not present

## 2021-03-17 DIAGNOSIS — M5136 Other intervertebral disc degeneration, lumbar region: Secondary | ICD-10-CM | POA: Diagnosis not present

## 2021-03-17 DIAGNOSIS — G894 Chronic pain syndrome: Secondary | ICD-10-CM | POA: Diagnosis not present

## 2021-03-17 DIAGNOSIS — R7303 Prediabetes: Secondary | ICD-10-CM | POA: Diagnosis not present

## 2021-03-17 DIAGNOSIS — R030 Elevated blood-pressure reading, without diagnosis of hypertension: Secondary | ICD-10-CM | POA: Diagnosis not present

## 2021-03-17 DIAGNOSIS — Z79899 Other long term (current) drug therapy: Secondary | ICD-10-CM | POA: Diagnosis not present

## 2021-03-21 DIAGNOSIS — H52203 Unspecified astigmatism, bilateral: Secondary | ICD-10-CM | POA: Diagnosis not present

## 2021-03-21 DIAGNOSIS — H26492 Other secondary cataract, left eye: Secondary | ICD-10-CM | POA: Diagnosis not present

## 2021-03-21 DIAGNOSIS — H5203 Hypermetropia, bilateral: Secondary | ICD-10-CM | POA: Diagnosis not present

## 2021-03-21 DIAGNOSIS — Z79899 Other long term (current) drug therapy: Secondary | ICD-10-CM | POA: Diagnosis not present

## 2021-03-26 DIAGNOSIS — G4733 Obstructive sleep apnea (adult) (pediatric): Secondary | ICD-10-CM | POA: Diagnosis not present

## 2021-03-31 DIAGNOSIS — Z23 Encounter for immunization: Secondary | ICD-10-CM | POA: Diagnosis not present

## 2021-04-13 DIAGNOSIS — R030 Elevated blood-pressure reading, without diagnosis of hypertension: Secondary | ICD-10-CM | POA: Diagnosis not present

## 2021-04-13 DIAGNOSIS — M5136 Other intervertebral disc degeneration, lumbar region: Secondary | ICD-10-CM | POA: Diagnosis not present

## 2021-04-13 DIAGNOSIS — G894 Chronic pain syndrome: Secondary | ICD-10-CM | POA: Diagnosis not present

## 2021-04-13 DIAGNOSIS — Z79899 Other long term (current) drug therapy: Secondary | ICD-10-CM | POA: Diagnosis not present

## 2021-04-13 DIAGNOSIS — R7303 Prediabetes: Secondary | ICD-10-CM | POA: Diagnosis not present

## 2021-04-25 DIAGNOSIS — G4733 Obstructive sleep apnea (adult) (pediatric): Secondary | ICD-10-CM | POA: Diagnosis not present

## 2021-05-02 DIAGNOSIS — G4730 Sleep apnea, unspecified: Secondary | ICD-10-CM | POA: Diagnosis not present

## 2021-05-02 DIAGNOSIS — I700 Atherosclerosis of aorta: Secondary | ICD-10-CM | POA: Diagnosis not present

## 2021-05-02 DIAGNOSIS — E1169 Type 2 diabetes mellitus with other specified complication: Secondary | ICD-10-CM | POA: Diagnosis not present

## 2021-05-02 DIAGNOSIS — J45909 Unspecified asthma, uncomplicated: Secondary | ICD-10-CM | POA: Diagnosis not present

## 2021-05-02 DIAGNOSIS — I10 Essential (primary) hypertension: Secondary | ICD-10-CM | POA: Diagnosis not present

## 2021-05-02 DIAGNOSIS — M549 Dorsalgia, unspecified: Secondary | ICD-10-CM | POA: Diagnosis not present

## 2021-05-02 DIAGNOSIS — Z862 Personal history of diseases of the blood and blood-forming organs and certain disorders involving the immune mechanism: Secondary | ICD-10-CM | POA: Diagnosis not present

## 2021-05-02 DIAGNOSIS — Z720 Tobacco use: Secondary | ICD-10-CM | POA: Diagnosis not present

## 2021-05-04 ENCOUNTER — Other Ambulatory Visit: Payer: Self-pay | Admitting: Family Medicine

## 2021-05-04 DIAGNOSIS — M545 Low back pain, unspecified: Secondary | ICD-10-CM

## 2021-05-13 DIAGNOSIS — Z79899 Other long term (current) drug therapy: Secondary | ICD-10-CM | POA: Diagnosis not present

## 2021-05-13 DIAGNOSIS — G894 Chronic pain syndrome: Secondary | ICD-10-CM | POA: Diagnosis not present

## 2021-05-13 DIAGNOSIS — R7303 Prediabetes: Secondary | ICD-10-CM | POA: Diagnosis not present

## 2021-05-13 DIAGNOSIS — M5136 Other intervertebral disc degeneration, lumbar region: Secondary | ICD-10-CM | POA: Diagnosis not present

## 2021-05-13 DIAGNOSIS — R030 Elevated blood-pressure reading, without diagnosis of hypertension: Secondary | ICD-10-CM | POA: Diagnosis not present

## 2021-05-17 DIAGNOSIS — Z79899 Other long term (current) drug therapy: Secondary | ICD-10-CM | POA: Diagnosis not present

## 2021-05-22 ENCOUNTER — Ambulatory Visit
Admission: RE | Admit: 2021-05-22 | Discharge: 2021-05-22 | Disposition: A | Discharge status: Home / Self Care | Payer: Medicare Other | Source: Ambulatory Visit | Attending: Family Medicine | Admitting: Family Medicine

## 2021-05-22 ENCOUNTER — Other Ambulatory Visit: Payer: Self-pay

## 2021-05-22 DIAGNOSIS — M545 Low back pain, unspecified: Secondary | ICD-10-CM | POA: Diagnosis not present

## 2021-05-22 DIAGNOSIS — M48061 Spinal stenosis, lumbar region without neurogenic claudication: Secondary | ICD-10-CM | POA: Diagnosis not present

## 2021-05-26 DIAGNOSIS — G4733 Obstructive sleep apnea (adult) (pediatric): Secondary | ICD-10-CM | POA: Diagnosis not present

## 2021-06-08 DIAGNOSIS — R030 Elevated blood-pressure reading, without diagnosis of hypertension: Secondary | ICD-10-CM | POA: Diagnosis not present

## 2021-06-08 DIAGNOSIS — Z1211 Encounter for screening for malignant neoplasm of colon: Secondary | ICD-10-CM | POA: Diagnosis not present

## 2021-06-17 DIAGNOSIS — R7303 Prediabetes: Secondary | ICD-10-CM | POA: Diagnosis not present

## 2021-06-17 DIAGNOSIS — Z79899 Other long term (current) drug therapy: Secondary | ICD-10-CM | POA: Diagnosis not present

## 2021-06-17 DIAGNOSIS — G894 Chronic pain syndrome: Secondary | ICD-10-CM | POA: Diagnosis not present

## 2021-06-17 DIAGNOSIS — M5136 Other intervertebral disc degeneration, lumbar region: Secondary | ICD-10-CM | POA: Diagnosis not present

## 2021-06-17 DIAGNOSIS — R030 Elevated blood-pressure reading, without diagnosis of hypertension: Secondary | ICD-10-CM | POA: Diagnosis not present

## 2021-06-21 DIAGNOSIS — Z79899 Other long term (current) drug therapy: Secondary | ICD-10-CM | POA: Diagnosis not present

## 2021-06-25 DIAGNOSIS — G4733 Obstructive sleep apnea (adult) (pediatric): Secondary | ICD-10-CM | POA: Diagnosis not present

## 2021-07-08 DIAGNOSIS — Z1211 Encounter for screening for malignant neoplasm of colon: Secondary | ICD-10-CM | POA: Diagnosis not present

## 2021-07-08 DIAGNOSIS — R030 Elevated blood-pressure reading, without diagnosis of hypertension: Secondary | ICD-10-CM | POA: Diagnosis not present

## 2021-07-18 DIAGNOSIS — R7303 Prediabetes: Secondary | ICD-10-CM | POA: Diagnosis not present

## 2021-07-18 DIAGNOSIS — F17210 Nicotine dependence, cigarettes, uncomplicated: Secondary | ICD-10-CM | POA: Diagnosis not present

## 2021-07-18 DIAGNOSIS — M5136 Other intervertebral disc degeneration, lumbar region: Secondary | ICD-10-CM | POA: Diagnosis not present

## 2021-07-18 DIAGNOSIS — R030 Elevated blood-pressure reading, without diagnosis of hypertension: Secondary | ICD-10-CM | POA: Diagnosis not present

## 2021-07-18 DIAGNOSIS — Z79899 Other long term (current) drug therapy: Secondary | ICD-10-CM | POA: Diagnosis not present

## 2021-07-18 DIAGNOSIS — F17200 Nicotine dependence, unspecified, uncomplicated: Secondary | ICD-10-CM | POA: Diagnosis not present

## 2021-07-18 DIAGNOSIS — G894 Chronic pain syndrome: Secondary | ICD-10-CM | POA: Diagnosis not present

## 2021-07-21 DIAGNOSIS — Z79899 Other long term (current) drug therapy: Secondary | ICD-10-CM | POA: Diagnosis not present

## 2021-07-26 DIAGNOSIS — G4733 Obstructive sleep apnea (adult) (pediatric): Secondary | ICD-10-CM | POA: Diagnosis not present

## 2021-07-27 DIAGNOSIS — G4733 Obstructive sleep apnea (adult) (pediatric): Secondary | ICD-10-CM | POA: Diagnosis not present

## 2021-08-04 ENCOUNTER — Other Ambulatory Visit: Payer: Self-pay | Admitting: Neurosurgery

## 2021-08-04 DIAGNOSIS — M5416 Radiculopathy, lumbar region: Secondary | ICD-10-CM

## 2021-08-08 DIAGNOSIS — R030 Elevated blood-pressure reading, without diagnosis of hypertension: Secondary | ICD-10-CM | POA: Diagnosis not present

## 2021-08-12 ENCOUNTER — Ambulatory Visit
Admission: RE | Admit: 2021-08-12 | Discharge: 2021-08-12 | Disposition: A | Discharge status: Home / Self Care | Payer: Medicare Other | Source: Ambulatory Visit | Attending: Neurosurgery | Admitting: Neurosurgery

## 2021-08-12 DIAGNOSIS — M2578 Osteophyte, vertebrae: Secondary | ICD-10-CM | POA: Diagnosis not present

## 2021-08-12 DIAGNOSIS — M4126 Other idiopathic scoliosis, lumbar region: Secondary | ICD-10-CM | POA: Diagnosis not present

## 2021-08-12 DIAGNOSIS — M5416 Radiculopathy, lumbar region: Secondary | ICD-10-CM

## 2021-08-12 DIAGNOSIS — M545 Low back pain, unspecified: Secondary | ICD-10-CM | POA: Diagnosis not present

## 2021-08-17 DIAGNOSIS — R030 Elevated blood-pressure reading, without diagnosis of hypertension: Secondary | ICD-10-CM | POA: Diagnosis not present

## 2021-08-17 DIAGNOSIS — Z79899 Other long term (current) drug therapy: Secondary | ICD-10-CM | POA: Diagnosis not present

## 2021-08-17 DIAGNOSIS — G894 Chronic pain syndrome: Secondary | ICD-10-CM | POA: Diagnosis not present

## 2021-08-17 DIAGNOSIS — R7303 Prediabetes: Secondary | ICD-10-CM | POA: Diagnosis not present

## 2021-08-17 DIAGNOSIS — F17200 Nicotine dependence, unspecified, uncomplicated: Secondary | ICD-10-CM | POA: Diagnosis not present

## 2021-08-17 DIAGNOSIS — F17210 Nicotine dependence, cigarettes, uncomplicated: Secondary | ICD-10-CM | POA: Diagnosis not present

## 2021-08-17 DIAGNOSIS — Z0000 Encounter for general adult medical examination without abnormal findings: Secondary | ICD-10-CM | POA: Diagnosis not present

## 2021-08-17 DIAGNOSIS — M5136 Other intervertebral disc degeneration, lumbar region: Secondary | ICD-10-CM | POA: Diagnosis not present

## 2021-08-26 DIAGNOSIS — G4733 Obstructive sleep apnea (adult) (pediatric): Secondary | ICD-10-CM | POA: Diagnosis not present

## 2021-08-30 ENCOUNTER — Ambulatory Visit: Payer: Self-pay

## 2021-08-30 ENCOUNTER — Encounter: Payer: Self-pay | Admitting: Orthopaedic Surgery

## 2021-08-30 ENCOUNTER — Other Ambulatory Visit: Payer: Self-pay

## 2021-08-30 ENCOUNTER — Ambulatory Visit (INDEPENDENT_AMBULATORY_CARE_PROVIDER_SITE_OTHER): Payer: Medicare Other | Admitting: Orthopaedic Surgery

## 2021-08-30 DIAGNOSIS — M25532 Pain in left wrist: Secondary | ICD-10-CM

## 2021-08-30 MED ORDER — METHYLPREDNISOLONE ACETATE 40 MG/ML IJ SUSP
13.3300 mg | INTRAMUSCULAR | Status: AC | PRN
  Administered 2021-08-30: 13.33 mg

## 2021-08-30 MED ORDER — LIDOCAINE HCL 1 % IJ SOLN
0.3000 mL | INTRAMUSCULAR | Status: AC | PRN
  Administered 2021-08-30: .3 mL

## 2021-08-30 MED ORDER — BUPIVACAINE HCL 0.5 % IJ SOLN
0.3300 mL | INTRAMUSCULAR | Status: AC | PRN
  Administered 2021-08-30: .33 mL
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
## 2021-09-01 DIAGNOSIS — M5416 Radiculopathy, lumbar region: Secondary | ICD-10-CM | POA: Diagnosis not present

## 2021-09-05 DIAGNOSIS — R030 Elevated blood-pressure reading, without diagnosis of hypertension: Secondary | ICD-10-CM | POA: Diagnosis not present

## 2021-09-14 DIAGNOSIS — Z79899 Other long term (current) drug therapy: Secondary | ICD-10-CM | POA: Diagnosis not present

## 2021-09-14 DIAGNOSIS — F17210 Nicotine dependence, cigarettes, uncomplicated: Secondary | ICD-10-CM | POA: Diagnosis not present

## 2021-09-14 DIAGNOSIS — F17200 Nicotine dependence, unspecified, uncomplicated: Secondary | ICD-10-CM | POA: Diagnosis not present

## 2021-09-14 DIAGNOSIS — R030 Elevated blood-pressure reading, without diagnosis of hypertension: Secondary | ICD-10-CM | POA: Diagnosis not present

## 2021-09-14 DIAGNOSIS — M5136 Other intervertebral disc degeneration, lumbar region: Secondary | ICD-10-CM | POA: Diagnosis not present

## 2021-09-14 DIAGNOSIS — G894 Chronic pain syndrome: Secondary | ICD-10-CM | POA: Diagnosis not present

## 2021-09-14 DIAGNOSIS — R7303 Prediabetes: Secondary | ICD-10-CM | POA: Diagnosis not present

## 2021-09-19 DIAGNOSIS — Z79899 Other long term (current) drug therapy: Secondary | ICD-10-CM | POA: Diagnosis not present

## 2021-09-23 DIAGNOSIS — G4733 Obstructive sleep apnea (adult) (pediatric): Secondary | ICD-10-CM | POA: Diagnosis not present

## 2021-10-03 DIAGNOSIS — R030 Elevated blood-pressure reading, without diagnosis of hypertension: Secondary | ICD-10-CM | POA: Diagnosis not present

## 2021-10-11 DIAGNOSIS — Z79899 Other long term (current) drug therapy: Secondary | ICD-10-CM | POA: Diagnosis not present

## 2021-10-11 DIAGNOSIS — F17200 Nicotine dependence, unspecified, uncomplicated: Secondary | ICD-10-CM | POA: Diagnosis not present

## 2021-10-11 DIAGNOSIS — R7303 Prediabetes: Secondary | ICD-10-CM | POA: Diagnosis not present

## 2021-10-11 DIAGNOSIS — R030 Elevated blood-pressure reading, without diagnosis of hypertension: Secondary | ICD-10-CM | POA: Diagnosis not present

## 2021-10-11 DIAGNOSIS — F17210 Nicotine dependence, cigarettes, uncomplicated: Secondary | ICD-10-CM | POA: Diagnosis not present

## 2021-10-11 DIAGNOSIS — G894 Chronic pain syndrome: Secondary | ICD-10-CM | POA: Diagnosis not present

## 2021-10-11 DIAGNOSIS — M5136 Other intervertebral disc degeneration, lumbar region: Secondary | ICD-10-CM | POA: Diagnosis not present

## 2021-10-13 DIAGNOSIS — Z79899 Other long term (current) drug therapy: Secondary | ICD-10-CM | POA: Diagnosis not present

## 2021-10-31 DIAGNOSIS — E1169 Type 2 diabetes mellitus with other specified complication: Secondary | ICD-10-CM | POA: Diagnosis not present

## 2021-10-31 DIAGNOSIS — Z0000 Encounter for general adult medical examination without abnormal findings: Secondary | ICD-10-CM | POA: Diagnosis not present

## 2021-10-31 DIAGNOSIS — E559 Vitamin D deficiency, unspecified: Secondary | ICD-10-CM | POA: Diagnosis not present

## 2021-10-31 DIAGNOSIS — Z720 Tobacco use: Secondary | ICD-10-CM | POA: Diagnosis not present

## 2021-10-31 DIAGNOSIS — E785 Hyperlipidemia, unspecified: Secondary | ICD-10-CM | POA: Diagnosis not present

## 2021-10-31 DIAGNOSIS — E538 Deficiency of other specified B group vitamins: Secondary | ICD-10-CM | POA: Diagnosis not present

## 2021-10-31 DIAGNOSIS — Z862 Personal history of diseases of the blood and blood-forming organs and certain disorders involving the immune mechanism: Secondary | ICD-10-CM | POA: Diagnosis not present

## 2021-10-31 DIAGNOSIS — M549 Dorsalgia, unspecified: Secondary | ICD-10-CM | POA: Diagnosis not present

## 2021-11-01 DIAGNOSIS — R030 Elevated blood-pressure reading, without diagnosis of hypertension: Secondary | ICD-10-CM | POA: Diagnosis not present

## 2021-11-10 DIAGNOSIS — G894 Chronic pain syndrome: Secondary | ICD-10-CM | POA: Diagnosis not present

## 2021-11-10 DIAGNOSIS — R030 Elevated blood-pressure reading, without diagnosis of hypertension: Secondary | ICD-10-CM | POA: Diagnosis not present

## 2021-11-10 DIAGNOSIS — F17210 Nicotine dependence, cigarettes, uncomplicated: Secondary | ICD-10-CM | POA: Diagnosis not present

## 2021-11-10 DIAGNOSIS — Z79899 Other long term (current) drug therapy: Secondary | ICD-10-CM | POA: Diagnosis not present

## 2021-11-10 DIAGNOSIS — R7303 Prediabetes: Secondary | ICD-10-CM | POA: Diagnosis not present

## 2021-11-10 DIAGNOSIS — M5136 Other intervertebral disc degeneration, lumbar region: Secondary | ICD-10-CM | POA: Diagnosis not present

## 2021-11-10 DIAGNOSIS — F17200 Nicotine dependence, unspecified, uncomplicated: Secondary | ICD-10-CM | POA: Diagnosis not present

## 2021-11-15 DIAGNOSIS — Z79899 Other long term (current) drug therapy: Secondary | ICD-10-CM | POA: Diagnosis not present

## 2021-11-29 DIAGNOSIS — R030 Elevated blood-pressure reading, without diagnosis of hypertension: Secondary | ICD-10-CM | POA: Diagnosis not present

## 2021-11-29 DIAGNOSIS — R5383 Other fatigue: Secondary | ICD-10-CM | POA: Diagnosis not present

## 2021-11-29 DIAGNOSIS — Z131 Encounter for screening for diabetes mellitus: Secondary | ICD-10-CM | POA: Diagnosis not present

## 2021-11-29 DIAGNOSIS — R0602 Shortness of breath: Secondary | ICD-10-CM | POA: Diagnosis not present

## 2021-12-12 DIAGNOSIS — G894 Chronic pain syndrome: Secondary | ICD-10-CM | POA: Diagnosis not present

## 2021-12-12 DIAGNOSIS — F17200 Nicotine dependence, unspecified, uncomplicated: Secondary | ICD-10-CM | POA: Diagnosis not present

## 2021-12-12 DIAGNOSIS — F17210 Nicotine dependence, cigarettes, uncomplicated: Secondary | ICD-10-CM | POA: Diagnosis not present

## 2021-12-12 DIAGNOSIS — R7303 Prediabetes: Secondary | ICD-10-CM | POA: Diagnosis not present

## 2021-12-12 DIAGNOSIS — R030 Elevated blood-pressure reading, without diagnosis of hypertension: Secondary | ICD-10-CM | POA: Diagnosis not present

## 2021-12-12 DIAGNOSIS — Z79899 Other long term (current) drug therapy: Secondary | ICD-10-CM | POA: Diagnosis not present

## 2021-12-12 DIAGNOSIS — M5136 Other intervertebral disc degeneration, lumbar region: Secondary | ICD-10-CM | POA: Diagnosis not present

## 2021-12-18 DIAGNOSIS — Z79899 Other long term (current) drug therapy: Secondary | ICD-10-CM | POA: Diagnosis not present

## 2021-12-29 DIAGNOSIS — R030 Elevated blood-pressure reading, without diagnosis of hypertension: Secondary | ICD-10-CM | POA: Diagnosis not present

## 2022-01-16 DIAGNOSIS — Z79899 Other long term (current) drug therapy: Secondary | ICD-10-CM | POA: Diagnosis not present

## 2022-01-16 DIAGNOSIS — R7303 Prediabetes: Secondary | ICD-10-CM | POA: Diagnosis not present

## 2022-01-16 DIAGNOSIS — F17210 Nicotine dependence, cigarettes, uncomplicated: Secondary | ICD-10-CM | POA: Diagnosis not present

## 2022-01-16 DIAGNOSIS — R030 Elevated blood-pressure reading, without diagnosis of hypertension: Secondary | ICD-10-CM | POA: Diagnosis not present

## 2022-01-16 DIAGNOSIS — G894 Chronic pain syndrome: Secondary | ICD-10-CM | POA: Diagnosis not present

## 2022-01-16 DIAGNOSIS — F17200 Nicotine dependence, unspecified, uncomplicated: Secondary | ICD-10-CM | POA: Diagnosis not present

## 2022-01-16 DIAGNOSIS — M5136 Other intervertebral disc degeneration, lumbar region: Secondary | ICD-10-CM | POA: Diagnosis not present

## 2022-01-18 DIAGNOSIS — Z79899 Other long term (current) drug therapy: Secondary | ICD-10-CM | POA: Diagnosis not present

## 2022-01-30 DIAGNOSIS — R030 Elevated blood-pressure reading, without diagnosis of hypertension: Secondary | ICD-10-CM | POA: Diagnosis not present

## 2022-02-16 DIAGNOSIS — Z79899 Other long term (current) drug therapy: Secondary | ICD-10-CM | POA: Diagnosis not present

## 2022-02-16 DIAGNOSIS — G894 Chronic pain syndrome: Secondary | ICD-10-CM | POA: Diagnosis not present

## 2022-02-16 DIAGNOSIS — R030 Elevated blood-pressure reading, without diagnosis of hypertension: Secondary | ICD-10-CM | POA: Diagnosis not present

## 2022-02-16 DIAGNOSIS — R7303 Prediabetes: Secondary | ICD-10-CM | POA: Diagnosis not present

## 2022-02-16 DIAGNOSIS — F17200 Nicotine dependence, unspecified, uncomplicated: Secondary | ICD-10-CM | POA: Diagnosis not present

## 2022-02-16 DIAGNOSIS — M5136 Other intervertebral disc degeneration, lumbar region: Secondary | ICD-10-CM | POA: Diagnosis not present

## 2022-02-16 DIAGNOSIS — F17210 Nicotine dependence, cigarettes, uncomplicated: Secondary | ICD-10-CM | POA: Diagnosis not present

## 2022-02-20 DIAGNOSIS — Z79899 Other long term (current) drug therapy: Secondary | ICD-10-CM | POA: Diagnosis not present

## 2022-03-01 DIAGNOSIS — R030 Elevated blood-pressure reading, without diagnosis of hypertension: Secondary | ICD-10-CM | POA: Diagnosis not present

## 2022-03-17 DIAGNOSIS — R030 Elevated blood-pressure reading, without diagnosis of hypertension: Secondary | ICD-10-CM | POA: Diagnosis not present

## 2022-03-17 DIAGNOSIS — M5136 Other intervertebral disc degeneration, lumbar region: Secondary | ICD-10-CM | POA: Diagnosis not present

## 2022-03-17 DIAGNOSIS — R7303 Prediabetes: Secondary | ICD-10-CM | POA: Diagnosis not present

## 2022-03-17 DIAGNOSIS — F17200 Nicotine dependence, unspecified, uncomplicated: Secondary | ICD-10-CM | POA: Diagnosis not present

## 2022-03-17 DIAGNOSIS — Z79899 Other long term (current) drug therapy: Secondary | ICD-10-CM | POA: Diagnosis not present

## 2022-03-17 DIAGNOSIS — F17210 Nicotine dependence, cigarettes, uncomplicated: Secondary | ICD-10-CM | POA: Diagnosis not present

## 2022-03-17 DIAGNOSIS — G894 Chronic pain syndrome: Secondary | ICD-10-CM | POA: Diagnosis not present

## 2022-04-03 DIAGNOSIS — R030 Elevated blood-pressure reading, without diagnosis of hypertension: Secondary | ICD-10-CM | POA: Diagnosis not present

## 2022-04-14 DIAGNOSIS — F17200 Nicotine dependence, unspecified, uncomplicated: Secondary | ICD-10-CM | POA: Diagnosis not present

## 2022-04-14 DIAGNOSIS — Z79899 Other long term (current) drug therapy: Secondary | ICD-10-CM | POA: Diagnosis not present

## 2022-04-14 DIAGNOSIS — R7303 Prediabetes: Secondary | ICD-10-CM | POA: Diagnosis not present

## 2022-04-14 DIAGNOSIS — R030 Elevated blood-pressure reading, without diagnosis of hypertension: Secondary | ICD-10-CM | POA: Diagnosis not present

## 2022-04-14 DIAGNOSIS — F17210 Nicotine dependence, cigarettes, uncomplicated: Secondary | ICD-10-CM | POA: Diagnosis not present

## 2022-04-14 DIAGNOSIS — G894 Chronic pain syndrome: Secondary | ICD-10-CM | POA: Diagnosis not present

## 2022-04-14 DIAGNOSIS — M5136 Other intervertebral disc degeneration, lumbar region: Secondary | ICD-10-CM | POA: Diagnosis not present

## 2022-05-02 DIAGNOSIS — E785 Hyperlipidemia, unspecified: Secondary | ICD-10-CM | POA: Diagnosis not present

## 2022-05-02 DIAGNOSIS — Z720 Tobacco use: Secondary | ICD-10-CM | POA: Diagnosis not present

## 2022-05-02 DIAGNOSIS — E559 Vitamin D deficiency, unspecified: Secondary | ICD-10-CM | POA: Diagnosis not present

## 2022-05-02 DIAGNOSIS — G4730 Sleep apnea, unspecified: Secondary | ICD-10-CM | POA: Diagnosis not present

## 2022-05-02 DIAGNOSIS — Z23 Encounter for immunization: Secondary | ICD-10-CM | POA: Diagnosis not present

## 2022-05-02 DIAGNOSIS — J45909 Unspecified asthma, uncomplicated: Secondary | ICD-10-CM | POA: Diagnosis not present

## 2022-05-02 DIAGNOSIS — Z862 Personal history of diseases of the blood and blood-forming organs and certain disorders involving the immune mechanism: Secondary | ICD-10-CM | POA: Diagnosis not present

## 2022-05-02 DIAGNOSIS — I10 Essential (primary) hypertension: Secondary | ICD-10-CM | POA: Diagnosis not present

## 2022-05-02 DIAGNOSIS — E1169 Type 2 diabetes mellitus with other specified complication: Secondary | ICD-10-CM | POA: Diagnosis not present

## 2022-05-02 DIAGNOSIS — I700 Atherosclerosis of aorta: Secondary | ICD-10-CM | POA: Diagnosis not present

## 2022-05-02 DIAGNOSIS — M549 Dorsalgia, unspecified: Secondary | ICD-10-CM | POA: Diagnosis not present

## 2022-05-16 DIAGNOSIS — Z79899 Other long term (current) drug therapy: Secondary | ICD-10-CM | POA: Diagnosis not present

## 2022-05-16 DIAGNOSIS — R7303 Prediabetes: Secondary | ICD-10-CM | POA: Diagnosis not present

## 2022-05-16 DIAGNOSIS — R030 Elevated blood-pressure reading, without diagnosis of hypertension: Secondary | ICD-10-CM | POA: Diagnosis not present

## 2022-05-16 DIAGNOSIS — R7309 Other abnormal glucose: Secondary | ICD-10-CM | POA: Diagnosis not present

## 2022-05-16 DIAGNOSIS — G894 Chronic pain syndrome: Secondary | ICD-10-CM | POA: Diagnosis not present

## 2022-05-16 DIAGNOSIS — M5136 Other intervertebral disc degeneration, lumbar region: Secondary | ICD-10-CM | POA: Diagnosis not present

## 2022-05-16 DIAGNOSIS — F17200 Nicotine dependence, unspecified, uncomplicated: Secondary | ICD-10-CM | POA: Diagnosis not present

## 2022-05-16 DIAGNOSIS — F17210 Nicotine dependence, cigarettes, uncomplicated: Secondary | ICD-10-CM | POA: Diagnosis not present

## 2022-05-18 DIAGNOSIS — R030 Elevated blood-pressure reading, without diagnosis of hypertension: Secondary | ICD-10-CM | POA: Diagnosis not present

## 2022-06-16 DIAGNOSIS — Z79899 Other long term (current) drug therapy: Secondary | ICD-10-CM | POA: Diagnosis not present

## 2022-06-16 DIAGNOSIS — F17200 Nicotine dependence, unspecified, uncomplicated: Secondary | ICD-10-CM | POA: Diagnosis not present

## 2022-06-16 DIAGNOSIS — F17210 Nicotine dependence, cigarettes, uncomplicated: Secondary | ICD-10-CM | POA: Diagnosis not present

## 2022-06-16 DIAGNOSIS — R7303 Prediabetes: Secondary | ICD-10-CM | POA: Diagnosis not present

## 2022-06-16 DIAGNOSIS — G894 Chronic pain syndrome: Secondary | ICD-10-CM | POA: Diagnosis not present

## 2022-06-16 DIAGNOSIS — M5136 Other intervertebral disc degeneration, lumbar region: Secondary | ICD-10-CM | POA: Diagnosis not present

## 2022-06-16 DIAGNOSIS — R7309 Other abnormal glucose: Secondary | ICD-10-CM | POA: Diagnosis not present

## 2022-06-16 DIAGNOSIS — R030 Elevated blood-pressure reading, without diagnosis of hypertension: Secondary | ICD-10-CM | POA: Diagnosis not present

## 2022-06-17 DIAGNOSIS — R030 Elevated blood-pressure reading, without diagnosis of hypertension: Secondary | ICD-10-CM | POA: Diagnosis not present

## 2022-06-29 DIAGNOSIS — M545 Low back pain, unspecified: Secondary | ICD-10-CM | POA: Diagnosis not present

## 2022-06-29 DIAGNOSIS — M79605 Pain in left leg: Secondary | ICD-10-CM | POA: Diagnosis not present

## 2022-06-29 DIAGNOSIS — M6281 Muscle weakness (generalized): Secondary | ICD-10-CM | POA: Diagnosis not present

## 2022-07-07 DIAGNOSIS — M6281 Muscle weakness (generalized): Secondary | ICD-10-CM | POA: Diagnosis not present

## 2022-07-07 DIAGNOSIS — M79605 Pain in left leg: Secondary | ICD-10-CM | POA: Diagnosis not present

## 2022-07-07 DIAGNOSIS — M545 Low back pain, unspecified: Secondary | ICD-10-CM | POA: Diagnosis not present

## 2022-07-11 DIAGNOSIS — M79605 Pain in left leg: Secondary | ICD-10-CM | POA: Diagnosis not present

## 2022-07-11 DIAGNOSIS — M6281 Muscle weakness (generalized): Secondary | ICD-10-CM | POA: Diagnosis not present

## 2022-07-11 DIAGNOSIS — M545 Low back pain, unspecified: Secondary | ICD-10-CM | POA: Diagnosis not present

## 2022-07-17 DIAGNOSIS — M5136 Other intervertebral disc degeneration, lumbar region: Secondary | ICD-10-CM | POA: Diagnosis not present

## 2022-07-17 DIAGNOSIS — R7303 Prediabetes: Secondary | ICD-10-CM | POA: Diagnosis not present

## 2022-07-17 DIAGNOSIS — R7309 Other abnormal glucose: Secondary | ICD-10-CM | POA: Diagnosis not present

## 2022-07-17 DIAGNOSIS — F17210 Nicotine dependence, cigarettes, uncomplicated: Secondary | ICD-10-CM | POA: Diagnosis not present

## 2022-07-17 DIAGNOSIS — Z79899 Other long term (current) drug therapy: Secondary | ICD-10-CM | POA: Diagnosis not present

## 2022-07-17 DIAGNOSIS — G894 Chronic pain syndrome: Secondary | ICD-10-CM | POA: Diagnosis not present

## 2022-07-17 DIAGNOSIS — R030 Elevated blood-pressure reading, without diagnosis of hypertension: Secondary | ICD-10-CM | POA: Diagnosis not present

## 2022-07-17 DIAGNOSIS — F17200 Nicotine dependence, unspecified, uncomplicated: Secondary | ICD-10-CM | POA: Diagnosis not present

## 2022-07-21 DIAGNOSIS — Z79899 Other long term (current) drug therapy: Secondary | ICD-10-CM | POA: Diagnosis not present

## 2022-08-15 DIAGNOSIS — R7309 Other abnormal glucose: Secondary | ICD-10-CM | POA: Diagnosis not present

## 2022-08-15 DIAGNOSIS — F17200 Nicotine dependence, unspecified, uncomplicated: Secondary | ICD-10-CM | POA: Diagnosis not present

## 2022-08-15 DIAGNOSIS — R030 Elevated blood-pressure reading, without diagnosis of hypertension: Secondary | ICD-10-CM | POA: Diagnosis not present

## 2022-08-15 DIAGNOSIS — Z79899 Other long term (current) drug therapy: Secondary | ICD-10-CM | POA: Diagnosis not present

## 2022-08-15 DIAGNOSIS — F17210 Nicotine dependence, cigarettes, uncomplicated: Secondary | ICD-10-CM | POA: Diagnosis not present

## 2022-08-15 DIAGNOSIS — E559 Vitamin D deficiency, unspecified: Secondary | ICD-10-CM | POA: Diagnosis not present

## 2022-08-15 DIAGNOSIS — M5136 Other intervertebral disc degeneration, lumbar region: Secondary | ICD-10-CM | POA: Diagnosis not present

## 2022-08-15 DIAGNOSIS — G894 Chronic pain syndrome: Secondary | ICD-10-CM | POA: Diagnosis not present

## 2022-08-15 DIAGNOSIS — R7303 Prediabetes: Secondary | ICD-10-CM | POA: Diagnosis not present

## 2022-09-13 DIAGNOSIS — M5136 Other intervertebral disc degeneration, lumbar region: Secondary | ICD-10-CM | POA: Diagnosis not present

## 2022-09-13 DIAGNOSIS — G894 Chronic pain syndrome: Secondary | ICD-10-CM | POA: Diagnosis not present

## 2022-09-13 DIAGNOSIS — R030 Elevated blood-pressure reading, without diagnosis of hypertension: Secondary | ICD-10-CM | POA: Diagnosis not present

## 2022-09-13 DIAGNOSIS — R7309 Other abnormal glucose: Secondary | ICD-10-CM | POA: Diagnosis not present

## 2022-09-13 DIAGNOSIS — F17210 Nicotine dependence, cigarettes, uncomplicated: Secondary | ICD-10-CM | POA: Diagnosis not present

## 2022-09-13 DIAGNOSIS — Z79899 Other long term (current) drug therapy: Secondary | ICD-10-CM | POA: Diagnosis not present

## 2022-09-13 DIAGNOSIS — R7303 Prediabetes: Secondary | ICD-10-CM | POA: Diagnosis not present

## 2022-09-13 DIAGNOSIS — F17200 Nicotine dependence, unspecified, uncomplicated: Secondary | ICD-10-CM | POA: Diagnosis not present

## 2022-09-18 DIAGNOSIS — Z79899 Other long term (current) drug therapy: Secondary | ICD-10-CM | POA: Diagnosis not present

## 2022-10-12 DIAGNOSIS — M5136 Other intervertebral disc degeneration, lumbar region: Secondary | ICD-10-CM | POA: Diagnosis not present

## 2022-10-12 DIAGNOSIS — F17200 Nicotine dependence, unspecified, uncomplicated: Secondary | ICD-10-CM | POA: Diagnosis not present

## 2022-10-12 DIAGNOSIS — Z79899 Other long term (current) drug therapy: Secondary | ICD-10-CM | POA: Diagnosis not present

## 2022-10-12 DIAGNOSIS — R7303 Prediabetes: Secondary | ICD-10-CM | POA: Diagnosis not present

## 2022-10-12 DIAGNOSIS — R030 Elevated blood-pressure reading, without diagnosis of hypertension: Secondary | ICD-10-CM | POA: Diagnosis not present

## 2022-10-12 DIAGNOSIS — F17210 Nicotine dependence, cigarettes, uncomplicated: Secondary | ICD-10-CM | POA: Diagnosis not present

## 2022-10-12 DIAGNOSIS — G894 Chronic pain syndrome: Secondary | ICD-10-CM | POA: Diagnosis not present

## 2022-10-12 DIAGNOSIS — R7309 Other abnormal glucose: Secondary | ICD-10-CM | POA: Diagnosis not present

## 2022-10-16 DIAGNOSIS — Z79899 Other long term (current) drug therapy: Secondary | ICD-10-CM | POA: Diagnosis not present

## 2022-11-07 DIAGNOSIS — Z862 Personal history of diseases of the blood and blood-forming organs and certain disorders involving the immune mechanism: Secondary | ICD-10-CM | POA: Diagnosis not present

## 2022-11-07 DIAGNOSIS — E119 Type 2 diabetes mellitus without complications: Secondary | ICD-10-CM | POA: Diagnosis not present

## 2022-11-07 DIAGNOSIS — Z720 Tobacco use: Secondary | ICD-10-CM | POA: Diagnosis not present

## 2022-11-07 DIAGNOSIS — Z0000 Encounter for general adult medical examination without abnormal findings: Secondary | ICD-10-CM | POA: Diagnosis not present

## 2022-11-07 DIAGNOSIS — E1169 Type 2 diabetes mellitus with other specified complication: Secondary | ICD-10-CM | POA: Diagnosis not present

## 2022-11-07 DIAGNOSIS — I700 Atherosclerosis of aorta: Secondary | ICD-10-CM | POA: Diagnosis not present

## 2022-11-07 DIAGNOSIS — E538 Deficiency of other specified B group vitamins: Secondary | ICD-10-CM | POA: Diagnosis not present

## 2022-11-07 DIAGNOSIS — E559 Vitamin D deficiency, unspecified: Secondary | ICD-10-CM | POA: Diagnosis not present

## 2022-11-07 DIAGNOSIS — G9520 Unspecified cord compression: Secondary | ICD-10-CM | POA: Diagnosis not present

## 2022-11-07 DIAGNOSIS — E785 Hyperlipidemia, unspecified: Secondary | ICD-10-CM | POA: Diagnosis not present

## 2022-11-09 DIAGNOSIS — Z79899 Other long term (current) drug therapy: Secondary | ICD-10-CM | POA: Diagnosis not present

## 2022-11-09 DIAGNOSIS — R030 Elevated blood-pressure reading, without diagnosis of hypertension: Secondary | ICD-10-CM | POA: Diagnosis not present

## 2022-11-09 DIAGNOSIS — M5136 Other intervertebral disc degeneration, lumbar region: Secondary | ICD-10-CM | POA: Diagnosis not present

## 2022-11-09 DIAGNOSIS — R7303 Prediabetes: Secondary | ICD-10-CM | POA: Diagnosis not present

## 2022-11-09 DIAGNOSIS — G894 Chronic pain syndrome: Secondary | ICD-10-CM | POA: Diagnosis not present

## 2022-11-09 DIAGNOSIS — F17200 Nicotine dependence, unspecified, uncomplicated: Secondary | ICD-10-CM | POA: Diagnosis not present

## 2022-11-09 DIAGNOSIS — F17210 Nicotine dependence, cigarettes, uncomplicated: Secondary | ICD-10-CM | POA: Diagnosis not present

## 2022-11-09 DIAGNOSIS — R7309 Other abnormal glucose: Secondary | ICD-10-CM | POA: Diagnosis not present

## 2022-11-13 DIAGNOSIS — Z79899 Other long term (current) drug therapy: Secondary | ICD-10-CM | POA: Diagnosis not present

## 2022-12-11 DIAGNOSIS — M5136 Other intervertebral disc degeneration, lumbar region: Secondary | ICD-10-CM | POA: Diagnosis not present

## 2022-12-11 DIAGNOSIS — R030 Elevated blood-pressure reading, without diagnosis of hypertension: Secondary | ICD-10-CM | POA: Diagnosis not present

## 2022-12-11 DIAGNOSIS — R7303 Prediabetes: Secondary | ICD-10-CM | POA: Diagnosis not present

## 2022-12-11 DIAGNOSIS — Z79899 Other long term (current) drug therapy: Secondary | ICD-10-CM | POA: Diagnosis not present

## 2022-12-11 DIAGNOSIS — G894 Chronic pain syndrome: Secondary | ICD-10-CM | POA: Diagnosis not present

## 2022-12-11 DIAGNOSIS — F17200 Nicotine dependence, unspecified, uncomplicated: Secondary | ICD-10-CM | POA: Diagnosis not present

## 2022-12-11 DIAGNOSIS — R7309 Other abnormal glucose: Secondary | ICD-10-CM | POA: Diagnosis not present

## 2022-12-15 DIAGNOSIS — Z79899 Other long term (current) drug therapy: Secondary | ICD-10-CM | POA: Diagnosis not present

## 2023-01-08 DIAGNOSIS — R7309 Other abnormal glucose: Secondary | ICD-10-CM | POA: Diagnosis not present

## 2023-01-08 DIAGNOSIS — R7303 Prediabetes: Secondary | ICD-10-CM | POA: Diagnosis not present

## 2023-01-08 DIAGNOSIS — F17200 Nicotine dependence, unspecified, uncomplicated: Secondary | ICD-10-CM | POA: Diagnosis not present

## 2023-01-08 DIAGNOSIS — M5136 Other intervertebral disc degeneration, lumbar region: Secondary | ICD-10-CM | POA: Diagnosis not present

## 2023-01-08 DIAGNOSIS — R030 Elevated blood-pressure reading, without diagnosis of hypertension: Secondary | ICD-10-CM | POA: Diagnosis not present

## 2023-01-08 DIAGNOSIS — G894 Chronic pain syndrome: Secondary | ICD-10-CM | POA: Diagnosis not present

## 2023-01-08 DIAGNOSIS — Z79899 Other long term (current) drug therapy: Secondary | ICD-10-CM | POA: Diagnosis not present

## 2023-01-12 DIAGNOSIS — Z79899 Other long term (current) drug therapy: Secondary | ICD-10-CM | POA: Diagnosis not present

## 2023-02-08 DIAGNOSIS — M5136 Other intervertebral disc degeneration, lumbar region: Secondary | ICD-10-CM | POA: Diagnosis not present

## 2023-02-08 DIAGNOSIS — F17200 Nicotine dependence, unspecified, uncomplicated: Secondary | ICD-10-CM | POA: Diagnosis not present

## 2023-02-08 DIAGNOSIS — G894 Chronic pain syndrome: Secondary | ICD-10-CM | POA: Diagnosis not present

## 2023-02-08 DIAGNOSIS — R7303 Prediabetes: Secondary | ICD-10-CM | POA: Diagnosis not present

## 2023-02-08 DIAGNOSIS — R7309 Other abnormal glucose: Secondary | ICD-10-CM | POA: Diagnosis not present

## 2023-02-08 DIAGNOSIS — Z79899 Other long term (current) drug therapy: Secondary | ICD-10-CM | POA: Diagnosis not present

## 2023-02-13 DIAGNOSIS — Z79899 Other long term (current) drug therapy: Secondary | ICD-10-CM | POA: Diagnosis not present

## 2023-04-08 DIAGNOSIS — R21 Rash and other nonspecific skin eruption: Secondary | ICD-10-CM | POA: Diagnosis not present

## 2023-04-08 DIAGNOSIS — L0889 Other specified local infections of the skin and subcutaneous tissue: Secondary | ICD-10-CM | POA: Diagnosis not present

## 2023-04-23 DIAGNOSIS — J209 Acute bronchitis, unspecified: Secondary | ICD-10-CM | POA: Diagnosis not present

## 2023-04-23 DIAGNOSIS — Z8709 Personal history of other diseases of the respiratory system: Secondary | ICD-10-CM | POA: Diagnosis not present

## 2023-04-23 DIAGNOSIS — Z03818 Encounter for observation for suspected exposure to other biological agents ruled out: Secondary | ICD-10-CM | POA: Diagnosis not present

## 2023-04-23 DIAGNOSIS — R5383 Other fatigue: Secondary | ICD-10-CM | POA: Diagnosis not present

## 2023-04-23 DIAGNOSIS — R051 Acute cough: Secondary | ICD-10-CM | POA: Diagnosis not present

## 2023-04-23 DIAGNOSIS — R0981 Nasal congestion: Secondary | ICD-10-CM | POA: Diagnosis not present

## 2023-05-14 DIAGNOSIS — I10 Essential (primary) hypertension: Secondary | ICD-10-CM | POA: Diagnosis not present

## 2023-05-14 DIAGNOSIS — R197 Diarrhea, unspecified: Secondary | ICD-10-CM | POA: Diagnosis not present

## 2023-05-14 DIAGNOSIS — E119 Type 2 diabetes mellitus without complications: Secondary | ICD-10-CM | POA: Diagnosis not present

## 2023-05-14 DIAGNOSIS — Z720 Tobacco use: Secondary | ICD-10-CM | POA: Diagnosis not present

## 2023-05-14 DIAGNOSIS — E1169 Type 2 diabetes mellitus with other specified complication: Secondary | ICD-10-CM | POA: Diagnosis not present

## 2023-08-16 IMAGING — MR MR LUMBAR SPINE W/O CM
4 of 6 series · 21 of 48 positions shown · non-contrast
Comparison: MR lumbar 02/15/2020.

CLINICAL DATA: Low back pain radiating down bilateral legs. Prior
lumbar spine surgery in 1229.

EXAM:
MRI LUMBAR SPINE WITHOUT CONTRAST
TECHNIQUE: Multiplanar, multisequence MR imaging of the lumbar spine was
performed. No intravenous contrast was administered.

[Series 5: T2 · sagittal · 4.0mm · 0.73mm/px · 5 of 17 slices shown (1 of 3)]
[im 1/17]
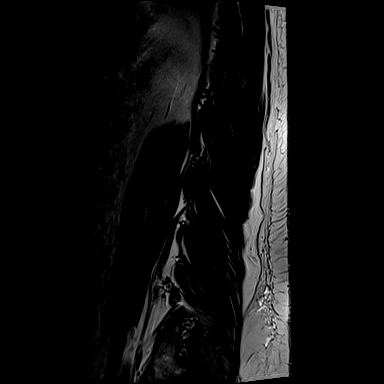
[im 5/17]
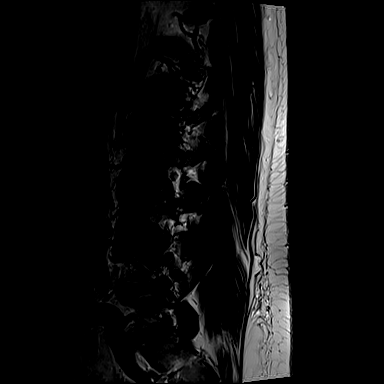
[im 9/17]
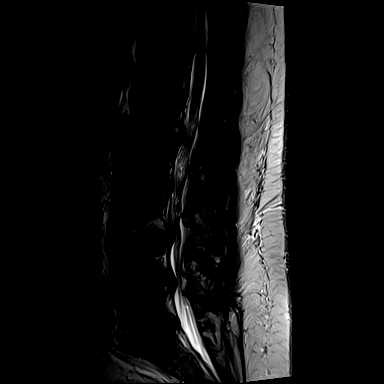
[im 13/17]
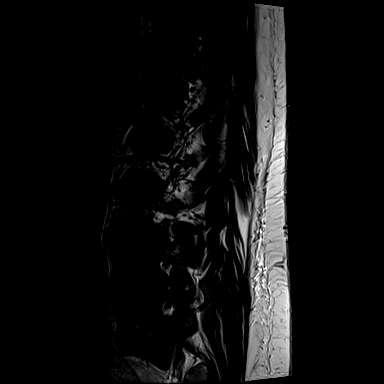
[im 17/17]
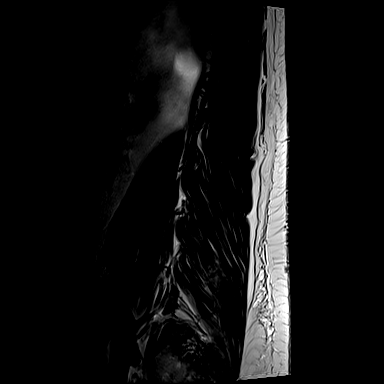

[Series 6: T2 · coronal · 5.0mm · 0.73mm/px · 5 of 17 slices shown (2 of 3)]
[im 1/17]
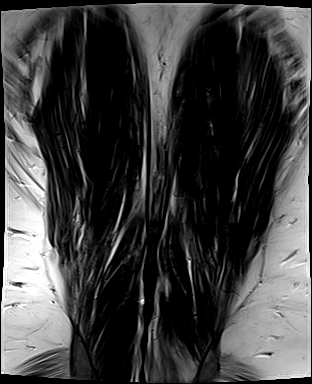
[im 5/17]
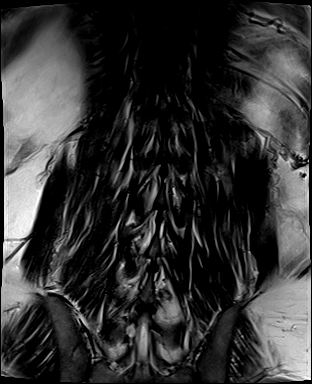
[im 9/17]
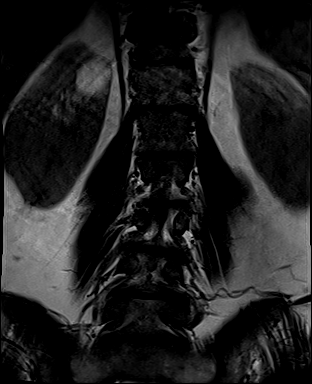
[im 13/17]
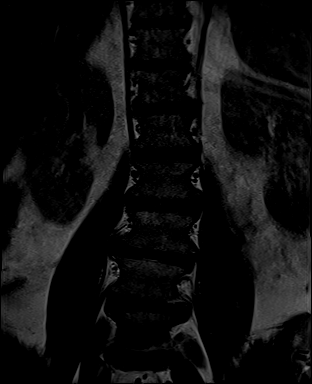
[im 17/17]
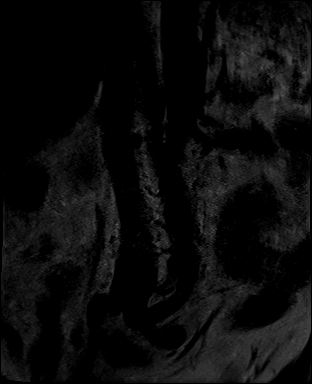

[Series 7: T1 · sagittal · 4.0mm · 0.73mm/px · 3 of 17 slices shown]
[im 1/17]
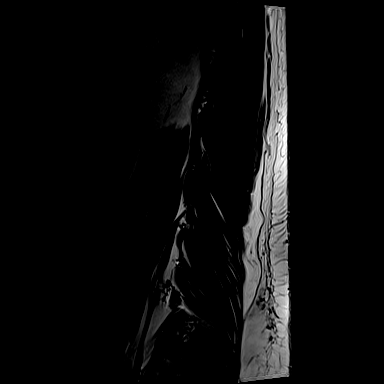
[im 9/17]
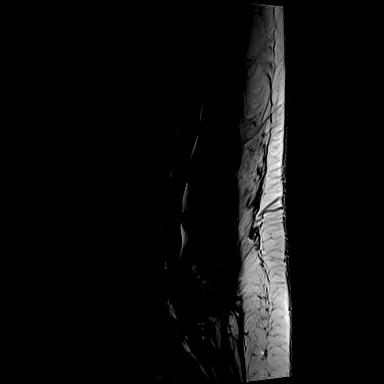
[im 17/17]
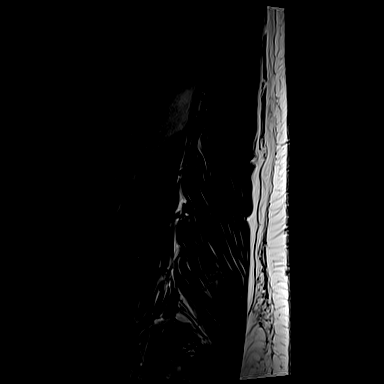

[Series 14: T2 · axial · 4.0mm · 0.28mm/px · z∈[-84,+141]mm · 8 of 44 slices shown (3 of 3)]
[im 1/44]
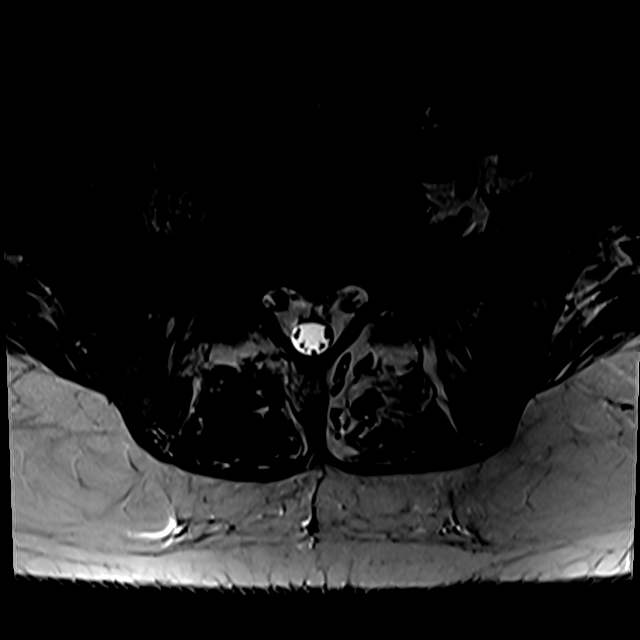
[im 7/44]
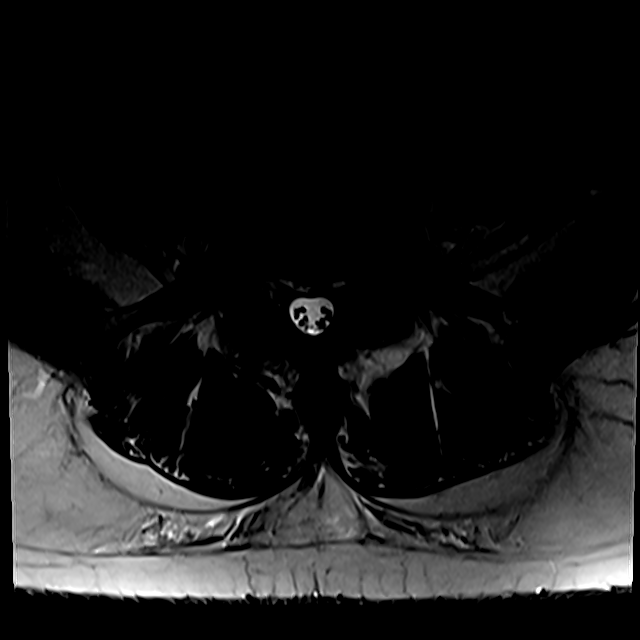
[im 14/44]
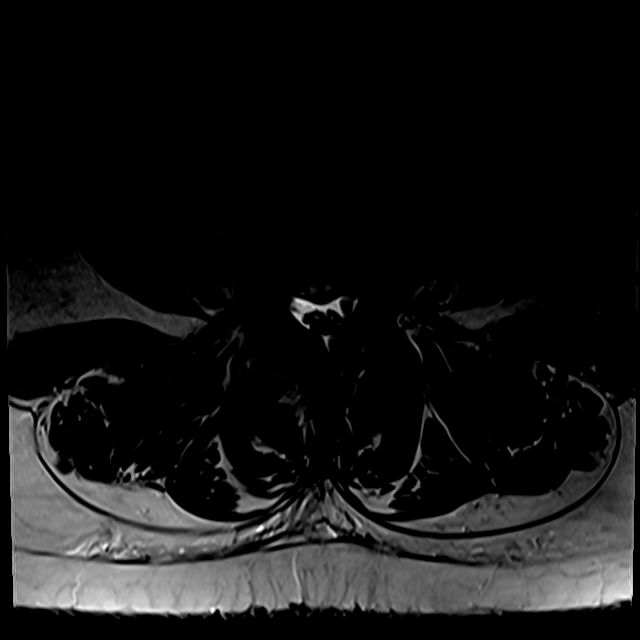
[im 20/44]
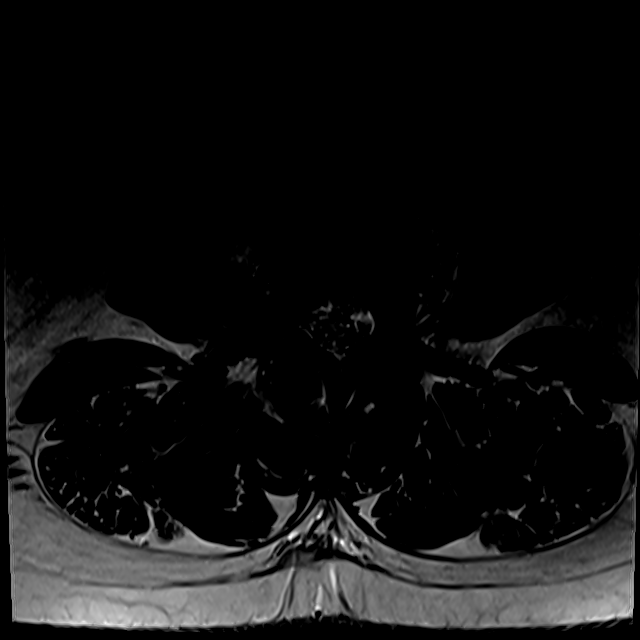
[im 24/44]
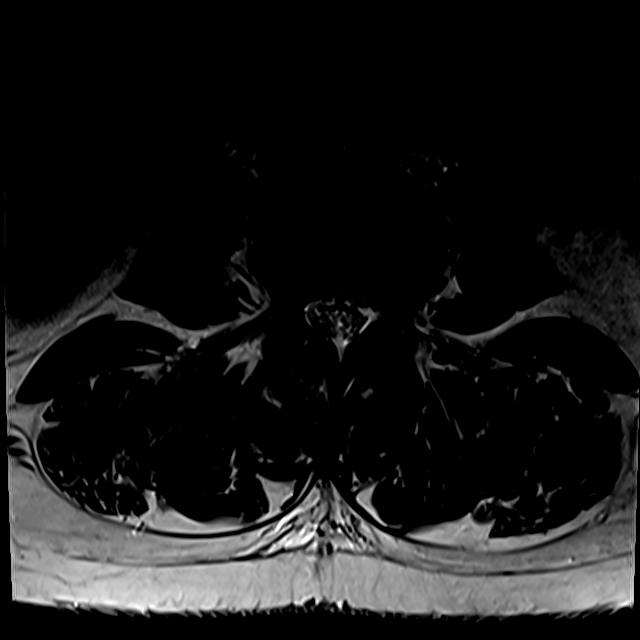
[im 30/44]
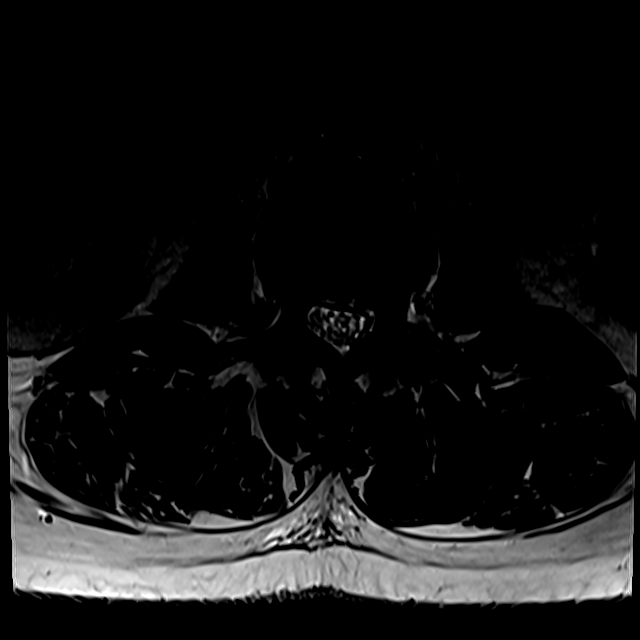
[im 37/44]
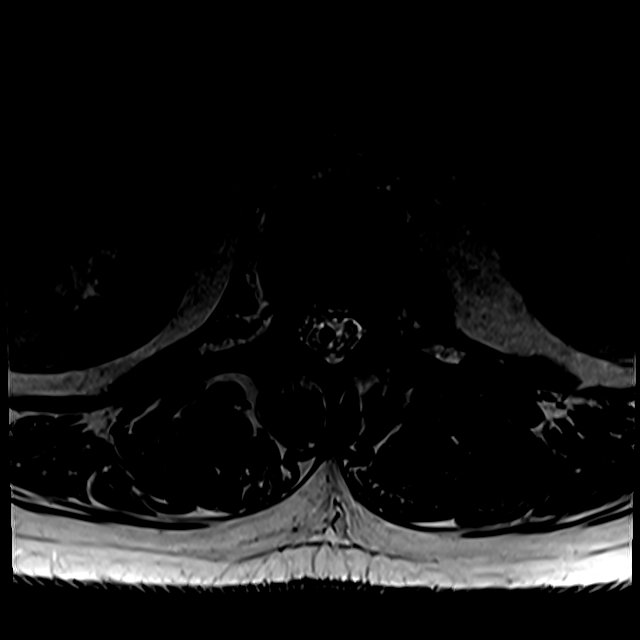
[im 44/44]
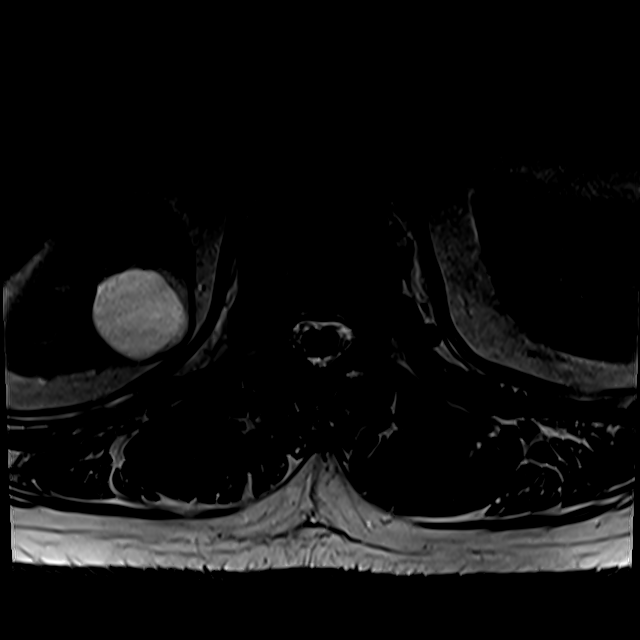

[21 of 48 positions shown; findings below may reference images not displayed]

FINDINGS: Segmentation:  Standard.

Alignment: Mild levocurvature. No significant anteroposterior
listhesis.

Vertebrae: Stable vertebral body heights with multilevel
degenerative endplate irregularity and Schmorl's nodes. Minor
degenerative marrow edema. No suspicious osseous lesion.

Conus medullaris and cauda equina: Conus extends to the L1 level.
Conus and cauda equina appear normal.

Paraspinal and other soft tissues: Minor paraspinal edema associated
with right L3-L4 facet arthropathy. Partially imaged right renal
cyst.

Disc levels: Congenital narrowing of the spinal canal. Imaged in the
sagittal plane only, there is a small disc bulge at T11-T12 with
ligamentum flavum thickening resulting in probable moderate canal
stenosis similar to prior study.

L1-L2: Disc bulge. Mild facet arthropathy. Mild canal stenosis. No
foraminal stenosis. Appearance is similar.

L2-L3: Disc bulge. Mild facet arthropathy. Mild to moderate canal
stenosis. No foraminal stenosis. Appearance is similar.

L3-L4: Disc bulge with endplate osteophytic ridging. Moderate right
and mild left facet arthropathy with ligamentum flavum infolding.
Moderate to marked canal stenosis. Effacement of subarticular
recesses. Mild foraminal stenosis. Appearance is similar.

L4-L5: Prior left laminotomy or laminectomy. Disc bulge eccentric to
the left. Moderate facet arthropathy with residual ligamentum flavum
infolding. Moderate canal stenosis greater on the left. Effacement
of left greater than right subarticular recesses. Mild right and
moderate left foraminal stenosis. Appearance is similar.

L5-S1: Prior right laminectomy. Disc bulge with endplate osteophytic
ridging. Mild facet arthropathy. No canal stenosis. Mild foraminal
stenosis. Appearance is similar.
IMPRESSION: Multilevel degenerative changes superimposed on congenital canal
narrowing as detailed above without substantial change since 4142
examination. Canal and subarticular recess stenosis greatest at
L3-L4 and L4-L5.

## 2023-11-06 IMAGING — CT CT L SPINE W/O CM
1 of 6 series · 6 of 14 positions shown, 8 images · non-contrast
Comparison: Lumbar MRI 05/22/2021.

CLINICAL DATA: 50-year-old male with low back pain radiating to the
left leg. Symptoms exacerbated by standing and walking. Remote L4-L5
surgery in [REDACTED]. Multiple falls in the past 6 months.



[Series 2: l-spine 2.0 st · axial · 0.31mm/px · z∈[-686,-498]mm · 6 of 132 slices shown, 8 images]
[im 19/132  soft-tissue]
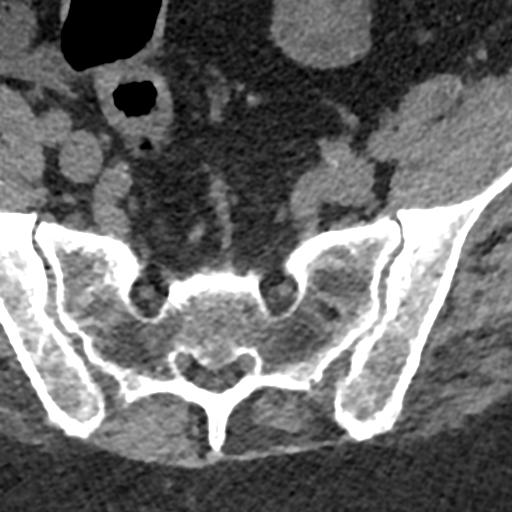
[im 19/132  bone]
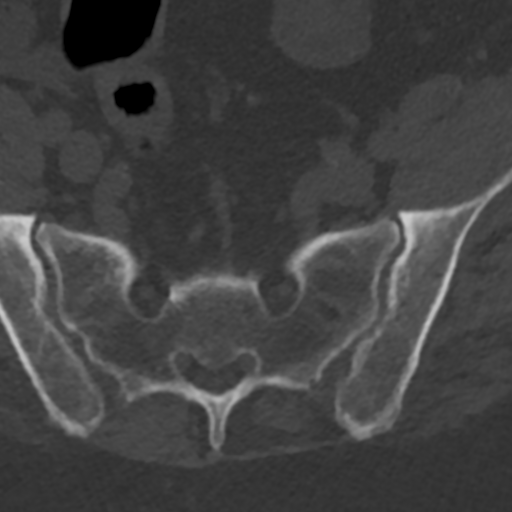
[im 38/132  bone]
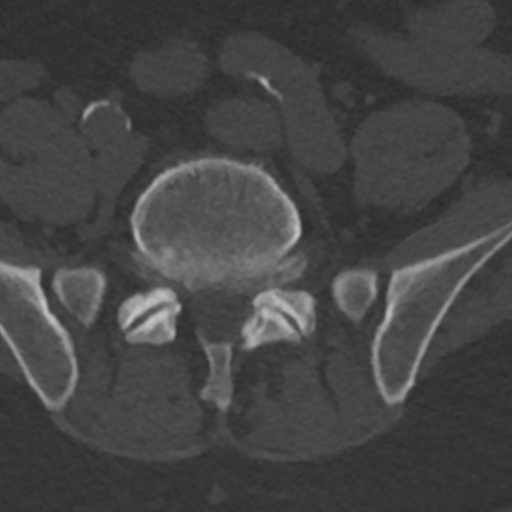
[im 57/132  bone]
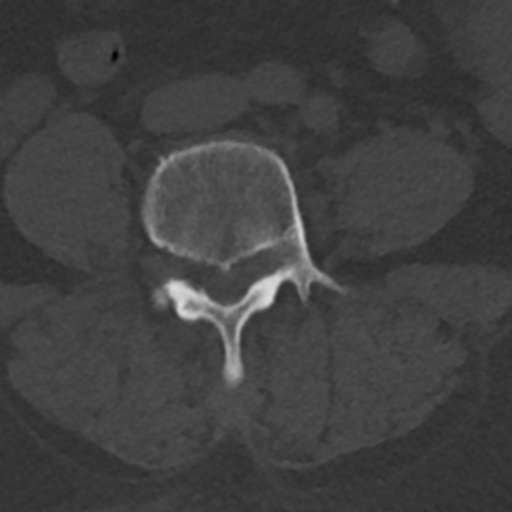
[im 75/132  bone]
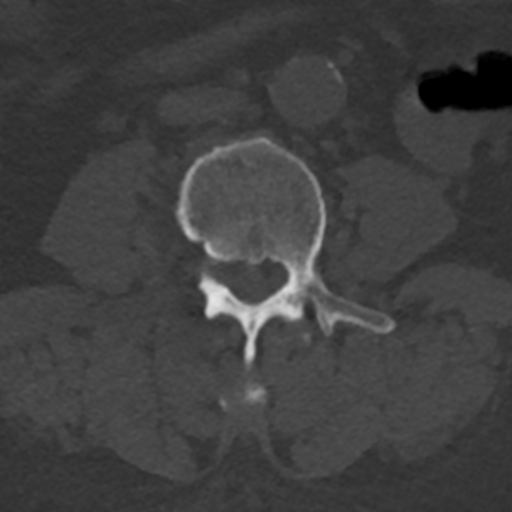
[im 94/132  soft-tissue]
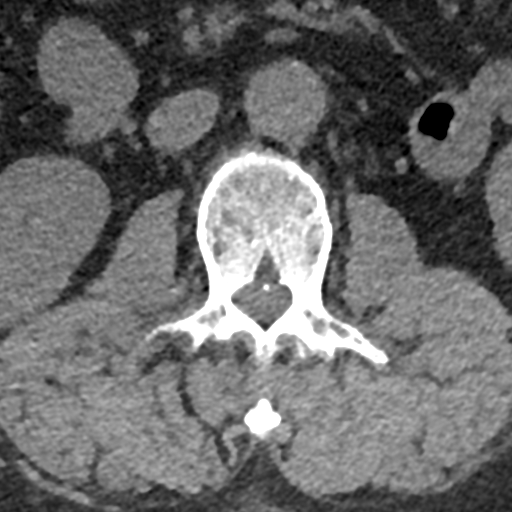
[im 94/132  bone]
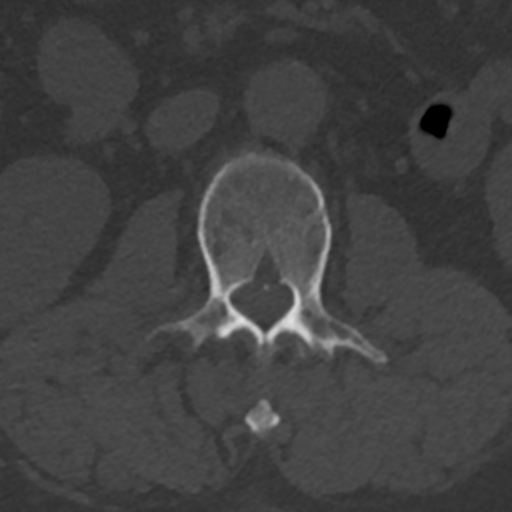
[im 113/132  bone]
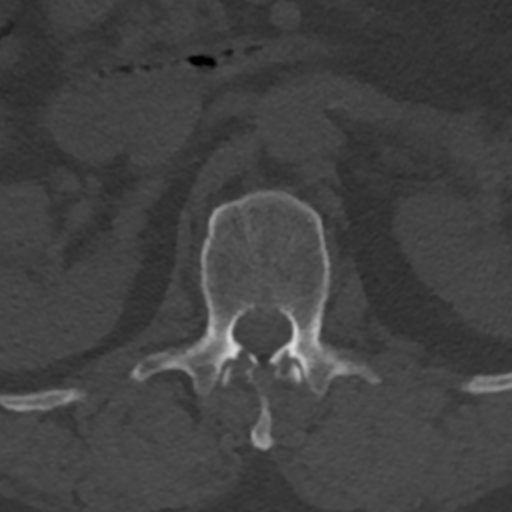

[6 of 14 positions shown; findings below may reference images not displayed]

FINDINGS: Segmentation: Normal, the same numbering system used on the [facility]
MRI.

Alignment: Straightening of lordosis which appears mildly improved
today compared to [facility]. There is mild levoconvex lumbar
scoliosis. No significant spondylolisthesis.

Vertebrae: No acute osseous abnormality identified. Intact visible
sacrum and SI joints.

Paraspinal and other soft tissues: Mild infrarenal abdominal aortic
aneurysm, 30 mm diameter. Mild-to-moderate Aortoiliac calcified
atherosclerosis. Partially visible benign appearing right renal
cyst. Negative visible other abdominal viscera.

Lumbar paraspinal soft tissues are within normal limits.

Disc levels:

Disc space loss, disc bulging and endplate spurring.

The levels L1-L2 to L3-L4 appears stable since the [facility] MRI with
associated mild to moderate multifactorial spinal stenosis, maximal
at L3-L4. Prominent right side vacuum facet at that time.

L4-L5: Previous left hemilaminectomy. Disc space loss with
circumferential disc osteophyte complex eccentric to the left
appears stable with spinal stenosis and moderate to severe left
lateral recess stenosis. And there is ongoing asymmetric left
foraminal involvement by disc.

L5-S1: Severe disc space loss with vacuum disc. Stable
circumferential disc osteophyte complex with only foraminal
stenosis, mild to moderate on the left.
IMPRESSION: 1. No acute osseous abnormality in the lumbar spine. Previous left
hemilaminectomy at L4-L5.
Stable lumbar spine degeneration and subsequent degenerative
stenosis since the [facility] MRI, moderate to severe at L3-L4 and
L4-L5.

2. Mild infrarenal Abdominal Aortic Aneurysm., 3.0 cm. Recommend
follow-up ultrasound every 3 years. This recommendation follows ACR
consensus guidelines: White Paper of the ACR Incidental Findings
Committee II on Vascular Findings. [HOSPITAL] 1977;
[DATE]. Aortic Atherosclerosis ([id number].B).

## 2023-11-07 DIAGNOSIS — Z0000 Encounter for general adult medical examination without abnormal findings: Secondary | ICD-10-CM | POA: Diagnosis not present

## 2023-11-07 DIAGNOSIS — G9520 Unspecified cord compression: Secondary | ICD-10-CM | POA: Diagnosis not present

## 2023-11-07 DIAGNOSIS — M549 Dorsalgia, unspecified: Secondary | ICD-10-CM | POA: Diagnosis not present

## 2023-11-07 DIAGNOSIS — E559 Vitamin D deficiency, unspecified: Secondary | ICD-10-CM | POA: Diagnosis not present

## 2023-11-07 DIAGNOSIS — Z720 Tobacco use: Secondary | ICD-10-CM | POA: Diagnosis not present

## 2023-11-07 DIAGNOSIS — J309 Allergic rhinitis, unspecified: Secondary | ICD-10-CM | POA: Diagnosis not present

## 2023-11-07 DIAGNOSIS — Z862 Personal history of diseases of the blood and blood-forming organs and certain disorders involving the immune mechanism: Secondary | ICD-10-CM | POA: Diagnosis not present

## 2023-11-07 DIAGNOSIS — E538 Deficiency of other specified B group vitamins: Secondary | ICD-10-CM | POA: Diagnosis not present

## 2023-11-07 DIAGNOSIS — E785 Hyperlipidemia, unspecified: Secondary | ICD-10-CM | POA: Diagnosis not present

## 2023-11-07 DIAGNOSIS — E1169 Type 2 diabetes mellitus with other specified complication: Secondary | ICD-10-CM | POA: Diagnosis not present

## 2024-04-23 NOTE — Progress Notes (Signed)
 Jack Greene                                          MRN: [id number]   04/23/2024   The [facility] Quality Team Specialist reviewed this patient medical record for the purposes of chart review for care gap closure. The following were reviewed: chart review for care gap closure-kidney health evaluation for diabetes:eGFR  and uACR & EED GAPS.    [facility] Quality Team

## 2024-05-08 DIAGNOSIS — E538 Deficiency of other specified B group vitamins: Secondary | ICD-10-CM | POA: Diagnosis not present

## 2024-05-08 DIAGNOSIS — E559 Vitamin D deficiency, unspecified: Secondary | ICD-10-CM | POA: Diagnosis not present

## 2024-05-08 DIAGNOSIS — Z8639 Personal history of other endocrine, nutritional and metabolic disease: Secondary | ICD-10-CM | POA: Diagnosis not present

## 2024-05-08 DIAGNOSIS — Z720 Tobacco use: Secondary | ICD-10-CM | POA: Diagnosis not present

## 2024-05-08 DIAGNOSIS — E785 Hyperlipidemia, unspecified: Secondary | ICD-10-CM | POA: Diagnosis not present

## 2024-05-08 DIAGNOSIS — I10 Essential (primary) hypertension: Secondary | ICD-10-CM | POA: Diagnosis not present

## 2024-05-08 DIAGNOSIS — M549 Dorsalgia, unspecified: Secondary | ICD-10-CM | POA: Diagnosis not present
